# Patient Record
Sex: Male | Born: 1955 | Race: White | Hispanic: No | Marital: Married | Smoking: Former smoker
Health system: Southern US, Community
[De-identification: ages and names within clinical notes are randomized; demographics above are authoritative.]

## PROBLEM LIST (undated history)

## (undated) DIAGNOSIS — M5136 Other intervertebral disc degeneration, lumbar region: Secondary | ICD-10-CM

## (undated) DIAGNOSIS — M51369 Other intervertebral disc degeneration, lumbar region without mention of lumbar back pain or lower extremity pain: Secondary | ICD-10-CM

## (undated) DIAGNOSIS — I10 Essential (primary) hypertension: Secondary | ICD-10-CM

## (undated) DIAGNOSIS — C801 Malignant (primary) neoplasm, unspecified: Secondary | ICD-10-CM

## (undated) DIAGNOSIS — IMO0002 Reserved for concepts with insufficient information to code with codable children: Secondary | ICD-10-CM

## (undated) DIAGNOSIS — E785 Hyperlipidemia, unspecified: Secondary | ICD-10-CM

## (undated) DIAGNOSIS — S838X2A Sprain of other specified parts of left knee, initial encounter: Secondary | ICD-10-CM

## (undated) DIAGNOSIS — R011 Cardiac murmur, unspecified: Secondary | ICD-10-CM

## (undated) DIAGNOSIS — Z85828 Personal history of other malignant neoplasm of skin: Secondary | ICD-10-CM

## (undated) HISTORY — DX: Reserved for concepts with insufficient information to code with codable children: IMO0002

## (undated) HISTORY — DX: Other intervertebral disc degeneration, lumbar region: M51.36

## (undated) HISTORY — DX: Cardiac murmur, unspecified: R01.1

## (undated) HISTORY — DX: Malignant (primary) neoplasm, unspecified: C80.1

## (undated) HISTORY — PX: MOHS SURGERY: SUR867

## (undated) HISTORY — DX: Essential (primary) hypertension: I10

## (undated) HISTORY — DX: Personal history of other malignant neoplasm of skin: Z85.828

## (undated) HISTORY — PX: POLYPECTOMY: SHX149

## (undated) HISTORY — DX: Sprain of other specified parts of left knee, initial encounter: S83.8X2A

## (undated) HISTORY — DX: Hyperlipidemia, unspecified: E78.5

## (undated) HISTORY — PX: KNEE SURGERY: SHX244

## (undated) HISTORY — DX: Other intervertebral disc degeneration, lumbar region without mention of lumbar back pain or lower extremity pain: M51.369

---

## 2004-01-12 ENCOUNTER — Ambulatory Visit: Payer: Self-pay | Admitting: Internal Medicine

## 2004-01-17 ENCOUNTER — Ambulatory Visit: Payer: Self-pay | Admitting: Internal Medicine

## 2005-01-08 ENCOUNTER — Ambulatory Visit: Payer: Self-pay | Admitting: Internal Medicine

## 2005-01-24 ENCOUNTER — Ambulatory Visit: Payer: Self-pay | Admitting: Internal Medicine

## 2005-02-05 HISTORY — PX: COLONOSCOPY: SHX174

## 2005-02-05 LAB — HM COLONOSCOPY

## 2005-12-11 ENCOUNTER — Ambulatory Visit: Payer: Self-pay | Admitting: Internal Medicine

## 2005-12-11 LAB — CONVERTED CEMR LAB
ALT: 26 units/L (ref 0–40)
AST: 32 units/L (ref 0–37)
Cholesterol: 181 mg/dL (ref 0–200)
HDL: 32.8 mg/dL — ABNORMAL LOW (ref >39.0)
LDL Cholesterol: 131 mg/dL — ABNORMAL HIGH (ref 0–99)
Total CK: 460 units/L (ref 7–195)

## 2005-12-14 ENCOUNTER — Ambulatory Visit: Payer: Self-pay | Admitting: Internal Medicine

## 2006-04-02 ENCOUNTER — Ambulatory Visit: Payer: Self-pay | Admitting: Internal Medicine

## 2006-04-15 ENCOUNTER — Ambulatory Visit: Payer: Self-pay | Admitting: Internal Medicine

## 2006-06-20 ENCOUNTER — Ambulatory Visit: Payer: Self-pay | Admitting: Gastroenterology

## 2006-07-02 ENCOUNTER — Encounter: Payer: Self-pay | Admitting: Internal Medicine

## 2006-07-02 ENCOUNTER — Ambulatory Visit: Payer: Self-pay | Admitting: Gastroenterology

## 2006-10-16 ENCOUNTER — Ambulatory Visit: Payer: Self-pay | Admitting: Internal Medicine

## 2006-10-20 LAB — CONVERTED CEMR LAB
LDL Cholesterol: 124 mg/dL — ABNORMAL HIGH (ref 0–99)
Total CHOL/HDL Ratio: 5.7

## 2006-10-21 ENCOUNTER — Encounter (INDEPENDENT_AMBULATORY_CARE_PROVIDER_SITE_OTHER): Payer: Self-pay | Admitting: *Deleted

## 2006-11-19 ENCOUNTER — Ambulatory Visit: Payer: Self-pay | Admitting: Internal Medicine

## 2006-11-19 DIAGNOSIS — E782 Mixed hyperlipidemia: Secondary | ICD-10-CM | POA: Insufficient documentation

## 2006-11-19 LAB — CONVERTED CEMR LAB
Cholesterol, target level: 200 mg/dL
LDL Goal: 130 mg/dL

## 2006-12-25 ENCOUNTER — Telehealth (INDEPENDENT_AMBULATORY_CARE_PROVIDER_SITE_OTHER): Payer: Self-pay | Admitting: *Deleted

## 2007-12-30 ENCOUNTER — Ambulatory Visit: Payer: Self-pay | Admitting: Internal Medicine

## 2007-12-30 DIAGNOSIS — I10 Essential (primary) hypertension: Secondary | ICD-10-CM | POA: Insufficient documentation

## 2007-12-30 DIAGNOSIS — M109 Gout, unspecified: Secondary | ICD-10-CM | POA: Insufficient documentation

## 2007-12-30 DIAGNOSIS — Z8739 Personal history of other diseases of the musculoskeletal system and connective tissue: Secondary | ICD-10-CM | POA: Insufficient documentation

## 2007-12-31 ENCOUNTER — Telehealth (INDEPENDENT_AMBULATORY_CARE_PROVIDER_SITE_OTHER): Payer: Self-pay | Admitting: *Deleted

## 2008-01-09 ENCOUNTER — Encounter (INDEPENDENT_AMBULATORY_CARE_PROVIDER_SITE_OTHER): Payer: Self-pay | Admitting: *Deleted

## 2008-01-12 ENCOUNTER — Telehealth (INDEPENDENT_AMBULATORY_CARE_PROVIDER_SITE_OTHER): Payer: Self-pay | Admitting: *Deleted

## 2008-01-13 ENCOUNTER — Encounter: Payer: Self-pay | Admitting: Internal Medicine

## 2008-01-13 ENCOUNTER — Telehealth: Payer: Self-pay | Admitting: Internal Medicine

## 2008-04-09 ENCOUNTER — Telehealth (INDEPENDENT_AMBULATORY_CARE_PROVIDER_SITE_OTHER): Payer: Self-pay | Admitting: *Deleted

## 2008-04-12 ENCOUNTER — Ambulatory Visit: Payer: Self-pay | Admitting: Internal Medicine

## 2008-04-18 LAB — CONVERTED CEMR LAB
Bilirubin, Direct: 0.1 mg/dL (ref 0.0–0.3)
Cholesterol: 195 mg/dL (ref 0–200)
LDL Cholesterol: 132 mg/dL — ABNORMAL HIGH (ref 0–99)
Triglycerides: 120 mg/dL (ref 0–149)
VLDL: 24 mg/dL (ref 0–40)

## 2008-04-22 ENCOUNTER — Encounter (INDEPENDENT_AMBULATORY_CARE_PROVIDER_SITE_OTHER): Payer: Self-pay | Admitting: *Deleted

## 2008-04-22 ENCOUNTER — Telehealth (INDEPENDENT_AMBULATORY_CARE_PROVIDER_SITE_OTHER): Payer: Self-pay | Admitting: *Deleted

## 2008-09-29 ENCOUNTER — Ambulatory Visit: Payer: Self-pay | Admitting: Internal Medicine

## 2008-10-12 LAB — CONVERTED CEMR LAB
Alkaline Phosphatase: 61 units/L (ref 39–117)
Bilirubin, Direct: 0 mg/dL (ref 0.0–0.3)
LDL Cholesterol: 106 mg/dL — ABNORMAL HIGH (ref 0–99)
Total Bilirubin: 0.8 mg/dL (ref 0.3–1.2)
Total CHOL/HDL Ratio: 4
VLDL: 24.2 mg/dL (ref 0.0–40.0)

## 2009-03-30 ENCOUNTER — Ambulatory Visit: Payer: Self-pay | Admitting: Internal Medicine

## 2009-06-30 ENCOUNTER — Ambulatory Visit: Payer: Self-pay | Admitting: Internal Medicine

## 2009-07-05 LAB — CONVERTED CEMR LAB
AST: 26 units/L (ref 0–37)
Alkaline Phosphatase: 68 units/L (ref 39–117)
Basophils Absolute: 0 10*3/uL (ref 0.0–0.1)
Basophils Relative: 0.4 % (ref 0.0–3.0)
Bilirubin, Direct: 0.1 mg/dL (ref 0.0–0.3)
CO2: 25 meq/L (ref 19–32)
Calcium: 8.9 mg/dL (ref 8.4–10.5)
Creatinine, Ser: 1.1 mg/dL (ref 0.4–1.5)
Eosinophils Absolute: 0.1 10*3/uL (ref 0.0–0.7)
GFR calc non Af Amer: 77.28 mL/min (ref >60)
HDL: 45.7 mg/dL (ref >39.00)
LDL Cholesterol: 109 mg/dL — ABNORMAL HIGH (ref 0–99)
Lymphocytes Relative: 25.1 % (ref 12.0–46.0)
MCHC: 34.5 g/dL (ref 30.0–36.0)
Monocytes Relative: 8.4 % (ref 3.0–12.0)
Neutrophils Relative %: 64.1 % (ref 43.0–77.0)
RBC: 4.62 M/uL (ref 4.22–5.81)
Sodium: 138 meq/L (ref 135–145)
Total CHOL/HDL Ratio: 4
Total Protein: 7.3 g/dL (ref 6.0–8.3)
Triglycerides: 163 mg/dL — ABNORMAL HIGH (ref 0.0–149.0)
Uric Acid, Serum: 7.4 mg/dL (ref 4.0–7.8)
VLDL: 32.6 mg/dL (ref 0.0–40.0)

## 2010-03-02 ENCOUNTER — Telehealth (INDEPENDENT_AMBULATORY_CARE_PROVIDER_SITE_OTHER): Payer: Self-pay | Admitting: *Deleted

## 2010-03-05 LAB — CONVERTED CEMR LAB
ALT: 26 units/L (ref 0–40)
AST: 20 units/L (ref 0–37)
AST: 29 units/L (ref 0–37)
Albumin: 3.8 g/dL (ref 3.5–5.2)
Albumin: 4.4 g/dL (ref 3.5–5.2)
Alkaline Phosphatase: 65 units/L (ref 39–117)
BUN: 13 mg/dL (ref 6–23)
BUN: 9 mg/dL (ref 6–23)
Basophils Absolute: 0 10*3/uL (ref 0.0–0.1)
Basophils Relative: 0.3 % (ref 0.0–1.0)
Chloride: 103 meq/L (ref 96–112)
Chloride: 106 meq/L (ref 96–112)
Eosinophils Absolute: 0.1 10*3/uL (ref 0.0–0.7)
Eosinophils Relative: 1.9 % (ref 0.0–5.0)
GFR calc Af Amer: 101 mL/min
GFR calc non Af Amer: 83 mL/min
HCT: 40.6 % (ref 39.0–52.0)
HDL: 46.3 mg/dL (ref >39.0)
LDL Cholesterol: 126 mg/dL — ABNORMAL HIGH (ref 0–99)
Lymphocytes Relative: 29.2 % (ref 12.0–46.0)
MCHC: 34.5 g/dL (ref 30.0–36.0)
MCV: 88.9 fL (ref 78.0–100.0)
Monocytes Relative: 2.6 % — ABNORMAL LOW (ref 3.0–12.0)
Neutrophils Relative %: 60.8 % (ref 43.0–77.0)
Neutrophils Relative %: 64.6 % (ref 43.0–77.0)
Platelets: 293 10*3/uL (ref 150–400)
Potassium: 4.2 meq/L (ref 3.5–5.1)
Potassium: 4.3 meq/L (ref 3.5–5.1)
RBC: 4.65 M/uL (ref 4.22–5.81)
RDW: 11.9 % (ref 11.5–14.6)
Sodium: 142 meq/L (ref 135–145)
Total Bilirubin: 0.7 mg/dL (ref 0.3–1.2)
Total CHOL/HDL Ratio: 4.3
VLDL: 27 mg/dL (ref 0–40)
WBC: 7.5 10*3/uL (ref 4.5–10.5)

## 2010-03-07 NOTE — Assessment & Plan Note (Signed)
Summary: backpain/kdc   Vital Signs:  Patient profile:   55 year old male Weight:      233 pounds BMI:     29.62 Temp:     98.2 degrees F oral Pulse rate:   76 / minute Resp:     15 per minute BP sitting:   138 / 80  (left arm) Cuff size:   large  Vitals Entered By: Shonna Chock (March 30, 2009 4:58 PM) CC: Back Pain since Monday Comments REVIEWED MED LIST, PATIENT AGREED DOSE AND INSTRUCTION CORRECT    CC:  Back Pain since Monday.  History of Present Illness: Acute back pain getting out of truck with constant spasming over 48 hrs. Physical Therapy , massage & stretching by  Casimiro Needle ?, Sports Therapist  @ his home.PMH of herniated disc LS spine as per Dr  Lesia Sago. Rx: Aleve & Relafen help.  Allergies: 1)  ! Pcn 2)  ! Lovaza (Omega-3-Acid Ethyl Esters)  Review of Systems General:  Denies chills, fever, and sweats. GU:  Denies incontinence. MS:  Complains of low back pain; denies joint pain, joint redness, and joint swelling. Derm:  Denies lesion(s) and rash. Neuro:  Denies brief paralysis, numbness, tingling, and weakness.  Physical Exam  General:  Uncomfortable   but in no acute distress; alert,appropriate and cooperative throughout examination Abdomen:  Bowel sounds positive,abdomen soft and non-tender without masses, organomegaly or hernias noted. Msk:  Slight tenderness R LS area to percussion Extremities:  No clubbing, cyanosis, edema, or deformity noted.  No radicular pain with SLR bilaterally but muscular pain with SLR. Classic low back crawl Neurologic:  alert & oriented X3, strength normal in all extremities, and DTRs symmetrical and normal.  Toe & heel walking WNL Skin:  Intact without suspicious lesions or rashes Psych:  normally interactive and good eye contact.     Impression & Recommendations:  Problem # 1:  LOW BACK PAIN, ACUTE (ICD-724.2)  His updated medication list for this problem includes:    Hydrocodone-acetaminophen 5-500 Mg Tabs  (Hydrocodone-acetaminophen) .Marland Kitchen... 1-2 q 4-6 hrs as needed    Cyclobenzaprine Hcl 5 Mg Tabs (Cyclobenzaprine hcl) .Marland Kitchen... 1 two times a day & 2 at bedtime as needed  Complete Medication List: 1)  Benazepril Hcl 40 Mg Tabs (Benazepril hcl) .Marland Kitchen.. 1 qd 2)  Toprol Xl 50 Mg Xr24h-tab (Metoprolol succinate) .... 1/2 -1 once daily to keep bp < 130/85 on average 3)  Welchol 3.75 G Susp (colesevelam Hcl)  .... Mix powder with 8 oz h2o, take with meal 4)  Hydrocodone-acetaminophen 5-500 Mg Tabs (Hydrocodone-acetaminophen) .Marland Kitchen.. 1-2 q 4-6 hrs as needed 5)  Cyclobenzaprine Hcl 5 Mg Tabs (Cyclobenzaprine hcl) .Marland Kitchen.. 1 two times a day & 2 at bedtime as needed  Patient Instructions: 1)  Continue PT & stretching. Consider an Inversion table if symptoms persist. Fill  Vicodin if Aleve 1-2 every 8-12 hrs with food as needed  ineffective  Prescriptions: CYCLOBENZAPRINE HCL 5 MG TABS (CYCLOBENZAPRINE HCL) 1 two times a day & 2 at bedtime as needed  #20 x 1   Entered and Authorized by:   Marga Melnick MD   Signed by:   Marga Melnick MD on 03/30/2009   Method used:   Faxed to ...       [facility]. [phone]* (retail)       [phone]       [place], [place]  [address]  Ph: [phone]       Fax: [phone]   RxID:   [id number] HYDROCODONE-ACETAMINOPHEN 5-500 MG TABS (HYDROCODONE-ACETAMINOPHEN) 1-2 q 4-6 hrs as needed  #30 x 0   Entered and Authorized by:   Marga Melnick MD   Signed by:   Marga Melnick MD on 03/30/2009   Method used:   Print then Give to Patient   RxID:   [id number]

## 2010-03-07 NOTE — Assessment & Plan Note (Signed)
Summary: CPX AND FASTING LABS///SPH   Vital Signs:  Patient profile:   55 year old male Height:      75 inches Weight:      234 pounds BMI:     29.35 Temp:     98.2 degrees F oral Pulse rate:   66 / minute Resp:     14 per minute BP sitting:   110 / 72  (left arm) Cuff size:   large  Vitals Entered By: Shonna Chock (Jun 30, 2009 9:59 AM)  CC: Lipid Management Comments REVIEWED MED LIST, PATIENT AGREED DOSE AND INSTRUCTION CORRECT    CC:  Lipid Management.  History of Present Illness: [name] is here for a physical; he is asymptomatic.  Lipid Management History:      Positive NCEP/ATP III risk factors include male age 52 years old or older, HDL cholesterol less than 40, and hypertension.  Negative NCEP/ATP III risk factors include non-diabetic, no family history for ischemic heart disease, non-tobacco-user status, no ASHD (atherosclerotic heart disease), no prior stroke/TIA, no peripheral vascular disease, and no history of aortic aneurysm.     Allergies: 1)  ! Pcn 2)  ! Lovaza (Omega-3-Acid Ethyl Esters)  Past History:  Past Medical History: DDD L5-6 ; MVP (Clinically,no 2 D Echo) Hyperlipidemia: [place] LDL goal = < 130. NMR 2008:LDL 118(1993/1506)TG 142,HDL 42. NMR LDL goal = < 80. Hypertension  Past Surgical History: R knee surgery 1976 for sports related torn  ligament/cartilage; Colonoscopy negative  2008  Family History: Father: HTN,MVP,elevated PSA, Mantle Cell Lymphoma Mother: valvular heart disease, HTN, ?  MI   Siblings: bro: DDD; MGF: MI @ 70,skin cancer; PGM: DM,HTN  Social History: Occupation:Stock Broker Married Alcohol use-yes: socially Regular exercise-yes: 3-4X/week 45-60 min  Review of Systems       The patient complains of decreased hearing.  The patient denies anorexia, fever, weight loss, weight gain, vision loss, hoarseness, chest pain, syncope, dyspnea on exertion, peripheral edema, prolonged cough, headaches, hemoptysis, abdominal  pain, melena, hematochezia, severe indigestion/heartburn, hematuria, suspicious skin lesions, depression, unusual weight change, abnormal bleeding, enlarged lymph nodes, and angioedema.         Some hearing loss from hunting foryears.  Physical Exam  General:  well-nourished; alert,appropriate and cooperative throughout examination Head:  Normocephalic and atraumatic without obvious abnormalities. No apparent alopecia or balding. Eyes:  No corneal or conjunctival inflammation noted. Perrla. Funduscopic exam benign, without hemorrhages, exudates or papilledema.  Ears:  External ear exam shows no significant lesions or deformities.  Otoscopic examination reveals clear canals, tympanic membranes are intact bilaterally without bulging, retraction, inflammation or discharge. Hearing is grossly normal bilaterally. Nose:  External nasal examination shows no deformity or inflammation. Nasal mucosa are pink and moist without lesions or exudates. Mouth:  Oral mucosa and oropharynx without lesions or exudates.  Teeth in good repair. Neck:  No deformities, masses, or tenderness noted. Lungs:  Normal respiratory effort, chest expands symmetrically. Lungs are clear to auscultation, no crackles or wheezes. Heart:  Normal rate and regular rhythm. S1 and S2 normal without gallop, murmur, click, rub or other extra sounds. Abdomen:  Bowel sounds positive,abdomen soft and non-tender without masses, organomegaly or hernias noted. Rectal:  No external abnormalities noted. Normal sphincter tone. No rectal masses or tenderness. Genitalia:  Testes bilaterally descended without nodularity, tenderness or masses. No scrotal masses or lesions. No penis lesions or urethral discharge. Prostate:  Prostate gland firm and smooth, no enlargement, nodularity, tenderness, mass, asymmetry or induration. Msk:  No  deformity or scoliosis noted of thoracic or lumbar spine.   Pulses:  R and L carotid,radial,dorsalis pedis and posterior  tibial pulses are full and equal bilaterally Extremities:  No clubbing, cyanosis, edema, or deformity noted with normal full range of motion of all joints.   Neurologic:  alert & oriented X3, gait normal, and DTRs symmetrical and normal.   Skin:  Intact without suspicious lesions or rashes Cervical Nodes:  No lymphadenopathy noted Axillary Nodes:  No palpable lymphadenopathy Inguinal Nodes:  No significant adenopathy Psych:  memory intact for recent and remote, normally interactive, and good eye contact.     Impression & Recommendations:  Problem # 1:  ROUTINE GENERAL MEDICAL EXAM@HEALTH  CARE FACL (ICD-V70.0)  Orders: EKG w/ Interpretation (93000) Venipuncture (98119) TLB-Lipid Panel (80061-LIPID) TLB-BMP (Basic Metabolic Panel-BMET) (80048-METABOL) TLB-CBC Platelet - w/Differential (85025-CBCD) TLB-Hepatic/Liver Function Pnl (80076-HEPATIC) TLB-TSH (Thyroid Stimulating Hormone) (84443-TSH) TLB-PSA (Prostate Specific Antigen) (84153-PSA) TLB-Uric Acid, Blood (84550-URIC)  Problem # 2:  HYPERLIPIDEMIA (ICD-272.2)  Orders: Venipuncture (14782) TLB-Lipid Panel (80061-LIPID)  Problem # 3:  HYPERTENSION (ICD-401.9)  His updated medication list for this problem includes:    Benazepril Hcl 40 Mg Tabs (Benazepril hcl) .Marland Kitchen... 1 qd    Toprol Xl 50 Mg Xr24h-tab (Metoprolol succinate) .Marland Kitchen... 1/2 -1 once daily to keep bp < 130/85 on average  Orders: EKG w/ Interpretation (93000) Venipuncture (95621)  Problem # 4:  GOUT, UNSPECIFIED (ICD-274.9)  PMH of  Orders: Venipuncture (30865) TLB-Uric Acid, Blood (84550-URIC)  Complete Medication List: 1)  Benazepril Hcl 40 Mg Tabs (Benazepril hcl) .Marland Kitchen.. 1 qd 2)  Toprol Xl 50 Mg Xr24h-tab (Metoprolol succinate) .... 1/2 -1 once daily to keep bp < 130/85 on average 3)  Welchol 3.75 G Susp (colesevelam Hcl)  .... Mix powder with 8 oz h2o, take with meal 4)  Multivitamins Tabs (Multiple vitamin) .... 2 mega mens daily  Lipid  Assessment/Plan:      Based on NCEP/ATP III, the patient's risk factor category is "2 or more risk factors and a calculated 10 year CAD risk of < 20%".  The patient's lipid goals are as follows: Total cholesterol goal is 200; LDL cholesterol goal is 90; HDL cholesterol goal is 40; Triglyceride goal is 150.  His LDL cholesterol goal has not been met.  Secondary causes for hyperlipidemia have been ruled out.  He has been counseled on adjunctive measures for lowering his cholesterol and has been provided with dietary instructions.    Patient Instructions: 1)  Check your Blood Pressure regularly. Your goal = AVERAGE < 135/85. Prescriptions: WELCHOL 3.75 G  SUSP  (COLESEVELAM HCL) mix powder with 8 oz H2O, take with meal  #1 month x 11   Entered and Authorized by:   Marga Melnick MD   Signed by:   Marga Melnick MD on 06/30/2009   Method used:   Print then Give to Patient   RxID:   [id number] TOPROL XL 50 MG XR24H-TAB (METOPROLOL SUCCINATE) 1/2 -1 once daily to keep BP < 130/85 ON AVERAGE  #90 x 1   Entered and Authorized by:   Marga Melnick MD   Signed by:   Marga Melnick MD on 06/30/2009   Method used:   Print then Give to Patient   RxID:   [id number] BENAZEPRIL HCL 40 MG TABS (BENAZEPRIL HCL) 1 qd  #90 Tablet x 3   Entered and Authorized by:   Marga Melnick MD   Signed by:   Marga Melnick MD on 06/30/2009   Method used:  Print then Give to Patient   RxID:   [id number]

## 2010-03-09 NOTE — Progress Notes (Signed)
Summary: refill  Phone Note Refill Request Message from:  Fax from Pharmacy on March 02, 2010 10:25 AM  Refills Requested: Medication #1:  WELCHOL 3.75 G  SUSP  (COLESEVELAM HCL) mix powder with 8 oz H2O [facility] - fax [phone]  Initial call taken by: Okey Regal [place],  March 02, 2010 10:26 AM    Prescriptions: WELCHOL 3.75 G  SUSP  (COLESEVELAM HCL) mix powder with 8 oz H2O, take with meal  #28mo x 1   Entered by:   Shonna Chock CMA   Authorized by:   Marga Melnick MD   Signed by:   Shonna Chock CMA on 03/02/2010   Method used:   Faxed to ...       [facility] (mail-order)             , [place]         Ph: [phone]       Fax: [phone]   RxID:   [id number]

## 2010-06-23 NOTE — Assessment & Plan Note (Signed)
[facility]                        [place] OFFICE NOTE   HARIS, BAACK                    MRN:          [id number]  DATE:04/15/2006                            DOB:          06-May-1955    Bradley Cole was seen for a comprehensive physical exam on April 15, 2006 at the age of 72.   He has no active symptoms or complaints at this time.   PAST MEDICAL HISTORY:  Is unchanged.  He had surgery in the right knee  for a soccer injury.  He also had a history of slipped disc.  History of  mitral valve prolapse.  Otherwise he has not been in the hospital  overnight.   He is presently on:  1. Toprol XL 50 one daily.  2. Benazepril  20/12.5 daily.  3. Relafen 750 as needed.  4. Enteric coated aspirin 81 mg.   FAMILY HISTORY:  Is positive for myocardial infarction, diabetes,  hypertension and valvular heart disease.  Specifically, both mother and  father had valvular heart disease.  His father also had an elevated PSA.   He does not smoke.  He drinks socially.  He walks 3 to 4 times a week,  45 to 60 minutes, works out in the gym for 45 to 60 minutes with no  cardiopulmonary symptoms.   He is 6 foot, 3 inches, weight is 235.6, which is up less than 6 pounds.  Pulse is 56 and regular.  Respiratory rate 14 and blood pressure 108/68.   REVIEW OF SYSTEMS:  Was reviewed in total and as stated he is  asymptomatic.   Complete physical examination was done with the exception of cranial  nerve exam.  He had minimal arterial changes on fundal exam. No spiking  finding or solar changes of the skin.  He has seen a dermatologist in  the past.  Efudex has apparently been recommended.  The cardiac exam was totally negative.  Prolapse was not obvious.  Prostate was upper limits of normal.  Hemoccult testing was negative.  He has minimal crepitus in the knees.  The remainder of the exam was  totally negative.   EKG reveals sinus bradycardia  and was normal.  Also normal were CBC and  diff,comprehensive metabolic profile, GFR, TSH, and PSA.   NMR lipid profile with LDL of 118, with 1,993 total particles and 1,506  small dense particles.  This would be associated with a 15% to 20% risk.  The profile suggested some dietary component but chiefly a genetic  issue.  Restriction of white carbs& desserts would be recommended.  [facility] or Mediterranean Diet would be appropriate. Generic  Pravastatin was discussed; he will consider this if TLC interventions  fail to correct the risks.   Because of his age a colonoscopy surveillance will be recommended.   Because of excellent blood pressure control, it is recommended that  Toprol be decreased to 1/2 pill daily.     Titus Dubin. Alwyn Ren, MD,FACP,FCCP  Electronically Signed    [facility]  DD: 04/15/2006  DT: 04/15/2006  Job #: [id number]

## 2010-06-28 ENCOUNTER — Other Ambulatory Visit: Payer: Self-pay | Admitting: *Deleted

## 2010-06-28 DIAGNOSIS — Z0000 Encounter for general adult medical examination without abnormal findings: Secondary | ICD-10-CM

## 2010-06-29 ENCOUNTER — Other Ambulatory Visit (INDEPENDENT_AMBULATORY_CARE_PROVIDER_SITE_OTHER): Payer: BC Managed Care – PPO

## 2010-06-29 DIAGNOSIS — Z0000 Encounter for general adult medical examination without abnormal findings: Secondary | ICD-10-CM

## 2010-06-29 LAB — CBC WITH DIFFERENTIAL/PLATELET
Basophils Relative: 0.3 % (ref 0.0–3.0)
Eosinophils Absolute: 0.1 10*3/uL (ref 0.0–0.7)
Eosinophils Relative: 2.3 % (ref 0.0–5.0)
Lymphocytes Relative: 29.8 % (ref 12.0–46.0)
Neutrophils Relative %: 59.3 % (ref 43.0–77.0)
RBC: 4.47 Mil/uL (ref 4.22–5.81)
WBC: 6.4 10*3/uL (ref 4.5–10.5)

## 2010-06-29 LAB — BASIC METABOLIC PANEL
CO2: 29 mEq/L (ref 19–32)
GFR: 74.56 mL/min (ref >60.00)
Glucose, Bld: 95 mg/dL (ref 70–99)
Potassium: 4.4 mEq/L (ref 3.5–5.1)
Sodium: 137 mEq/L (ref 135–145)

## 2010-06-29 LAB — LIPID PANEL
Cholesterol: 157 mg/dL (ref 0–200)
HDL: 41.1 mg/dL (ref >39.00)
Triglycerides: 140 mg/dL (ref 0.0–149.0)
VLDL: 28 mg/dL (ref 0.0–40.0)

## 2010-06-29 LAB — HEPATIC FUNCTION PANEL
ALT: 24 U/L (ref 0–53)
Total Protein: 7.2 g/dL (ref 6.0–8.3)

## 2010-07-06 ENCOUNTER — Encounter: Payer: Self-pay | Admitting: Internal Medicine

## 2010-07-07 ENCOUNTER — Encounter: Payer: BC Managed Care – PPO | Admitting: Internal Medicine

## 2010-07-07 ENCOUNTER — Encounter: Payer: Self-pay | Admitting: Internal Medicine

## 2010-07-18 ENCOUNTER — Encounter: Payer: Self-pay | Admitting: Internal Medicine

## 2010-07-18 ENCOUNTER — Ambulatory Visit (INDEPENDENT_AMBULATORY_CARE_PROVIDER_SITE_OTHER): Payer: BC Managed Care – PPO | Admitting: Internal Medicine

## 2010-07-18 VITALS — BP 116/68 | HR 56 | Temp 97.8°F | Resp 12 | Ht 75.5 in | Wt 241.2 lb

## 2010-07-18 DIAGNOSIS — E782 Mixed hyperlipidemia: Secondary | ICD-10-CM

## 2010-07-18 DIAGNOSIS — I10 Essential (primary) hypertension: Secondary | ICD-10-CM

## 2010-07-18 DIAGNOSIS — Z0000 Encounter for general adult medical examination without abnormal findings: Secondary | ICD-10-CM

## 2010-07-18 MED ORDER — BENAZEPRIL HCL 40 MG PO TABS: 40.0000 mg | ORAL_TABLET | Freq: Every day | ORAL | Status: DC

## 2010-07-18 MED ORDER — COLESEVELAM HCL 3.75 G PO PACK: 3.7500 | PACK | ORAL | Status: DC

## 2010-07-18 NOTE — Patient Instructions (Signed)
Preventive Health Care: Exercise at least 30-45 minutes a day,  3-4 days a week.  Eat a low-fat diet with lots of fruits and vegetables, up to 7-9 servings per day. Avoid obesity; your goal is waist measurement < 40 inches.Consume less than 40 grams of sugar per day from foods & drinks with High Fructose Corn Sugar as #2,3 or # 4 on label.   

## 2010-07-18 NOTE — Progress Notes (Signed)
  Subjective:    Patient ID: Bradley Cole, male    DOB: 1955-02-13, 55 y.o.   MRN: [id number]  HPI [name] is here for a physical; he is asymptomatic.   Review of Systems Patient reports no significant vision  changes,anorexia, weight change, fever ,adenopathy, persistant / recurrent hoarseness, swallowing issues, chest pain,palpitations, edema,persistant / recurrent cough, hemoptysis, dyspnea(rest, exertional, paroxysmal nocturnal), gastrointestinal  bleeding (melena, rectal bleeding), abdominal pain, excessive heart burn, GU symptoms( dysuria, hematuria, pyuria, voiding/incontinence  issues) syncope, focal weakness, memory loss,numbness & tingling, skin/hair/nail changes,depression, anxiety, abnormal bruising/bleeding, musculoskeletal symptoms/signs.  Long term hearing loss. Intermittent dry cough.     Objective:   Physical Exam Gen.: Healthy and well-nourished in appearance. Alert, appropriate and cooperative throughout exam. Head: Normocephalic without obvious abnormalities;  no alopecia  Eyes: No corneal or conjunctival inflammation noted. Pupils equal round reactive to light and accommodation. Fundal exam is benign without hemorrhages, exudate, papilledema. Extraocular motion intact. Vision grossly normal. Ears: External  ear exam reveals no significant lesions or deformities. Canals clear .TMs normal. Hearing is grossly normal bilaterally. Nose: External nasal exam reveals no deformity or inflammation. Nasal mucosa are pink and moist. No lesions or exudates noted. Septum not deviated  Mouth: Oral mucosa and oropharynx reveal no lesions or exudates. Teeth in good repair. Neck: No deformities, masses, or tenderness noted. Range of motion & . Thyroid normal. Lungs: Normal respiratory effort; chest expands symmetrically. Lungs are clear to auscultation without rales, wheezes, or increased work of breathing. Heart: Slow, regular rhythm. Normal S1 and S2. No gallop, click, or rub. No   murmur. Abdomen: Bowel sounds normal; abdomen soft and nontender. No masses, organomegaly or hernias noted. Genitalia/ DRE : normal except L varicocele.                                                                                      Musculoskeletal/extremities: No deformity or scoliosis noted of  the thoracic or lumbar spine. No clubbing, cyanosis, edema, or deformity noted. Range of motion  normal .Tone & strength  normal.Joints normal. Nail health  good. Vascular: Carotid, radial artery, dorsalis pedis and dorsalis posterior tibial pulses are full and equal. No bruits present. Neurologic: Alert and oriented x3. Deep tendon reflexes symmetrical and normal.          Skin: Intact without suspicious lesions or rashes. Lymph: No cervical, axillary, or inguinal lymphadenopathy present. Psych: Mood and affect are normal. Normally interactive                                                                                         Assessment & Plan:  #1 Comprehensive physical; no acute issues #2 Problem List assessed & recommendations discussed #3 dry cough due to ACE-I (Benazepril); change to Losartan declined @ this time

## 2010-07-18 NOTE — Assessment & Plan Note (Signed)
Minimal LDL goal = < 100, ideally <04.

## 2010-08-01 ENCOUNTER — Other Ambulatory Visit: Payer: Self-pay | Admitting: Internal Medicine

## 2010-08-01 NOTE — Telephone Encounter (Signed)
RX has already been done.

## 2010-09-26 ENCOUNTER — Encounter: Payer: Self-pay | Admitting: Internal Medicine

## 2010-09-26 ENCOUNTER — Ambulatory Visit (INDEPENDENT_AMBULATORY_CARE_PROVIDER_SITE_OTHER): Payer: BC Managed Care – PPO | Admitting: Internal Medicine

## 2010-09-26 DIAGNOSIS — R209 Unspecified disturbances of skin sensation: Secondary | ICD-10-CM

## 2010-09-26 DIAGNOSIS — M545 Low back pain: Secondary | ICD-10-CM

## 2010-09-26 MED ORDER — TRAMADOL HCL 50 MG PO TABS: 50.0000 mg | ORAL_TABLET | Freq: Four times a day (QID) | ORAL | Status: DC | PRN

## 2010-09-26 MED ORDER — GABAPENTIN 100 MG PO CAPS: 100.0000 mg | ORAL_CAPSULE | ORAL | Status: DC

## 2010-09-26 NOTE — Patient Instructions (Signed)
Used the gabapentin every 8 hours as needed for the numbness in the fifth toe. The tramadol would be for the back pain. Long-term use of non-steroidals may  cause gastritis or ulcer

## 2010-09-26 NOTE — Progress Notes (Signed)
Subjective:    Patient ID: Bradley Cole, male    DOB: Sep 23, 1955, 55 y.o.   MRN: [id number]  HPI BACK PAIN: Location: LS area   Severity: recently up to 3; up to 7 last month . NSAIDS with ASA  , massage helped pain decrease in severity  . Major concern now  is numbness in toes.   Worse with: no specific trigger    Better with: supine position helps pain  Pain radiates to: L hip & knee as "hot spot"   Impaired range of motion: slight in LLE  History of repetitive motion:  no  History of overuse or hyperextension:  no  History of trauma:  No; pain started after reaching for dog bowel 08/26/2010 Past history of similar problem:  yes, bulging disc @ L4-5 diagnosed 1978 by Dr Anne Hahn. Steroid burst resolved pain then  Symptoms Numbness/tingling:  yes, intermittent numbness either 5th toe. Single episode of numbness in L buttock which resolved with walking  Weakness:  no  Red Flags Fever:  no  Headache:  no  Bowel/bladder dysfunction:  no  Both his father and brother have had degenerative disc disease in LS spine.Both had LS surgery; His father also neck surgery for degenerative disc disease. This was associated with radiculopathy.      Review of Systems     Objective:   Physical Exam Gen.: Healthy and well-nourished in appearance. Alert, appropriate and cooperative throughout exam. Lungs: Normal respiratory effort; chest expands symmetrically. Lungs are clear to auscultation without rales, wheezes, or increased work of breathing. Heart: Normal rate and rhythm. Normal S1 and S2. No gallop, click, or rub. No  murmur. Abdomen: Bowel sounds normal; abdomen soft and nontender. No masses, organomegaly or hernias noted. No AAA or bruit                                                        Musculoskeletal/extremities: No deformity or scoliosis noted of  the thoracic or lumbar spine. No clubbing, cyanosis, edema, or deformity noted. Range of motion  normal .Tone & strength   normal.Joints normal. Nail health  good. He is able to lie back on the exam table and sit up without help. Straight leg raising is negative bilaterally Vascular: Carotid, radial artery, dorsalis pedis and  posterior tibial pulses are full and equal. No bruits present. Neurologic: Alert and oriented x3. Deep tendon reflexes symmetrical and normal. Heel and toe walking is normal.          Skin: Intact without suspicious lesions or rashes. Lymph: No cervical, axillary  lymphadenopathy present. Psych: Mood and affect are normal. Normally interactive                                                                                        Assessment & Plan:  #1 low back pain with radiation to the left hip and knee. No neuromuscular deficit present on clinical exam.  #2 intermittent numbness in the fifth toe bilaterally.  Transient numbness in the left hip which has resolved.  #3 degenerative disc disease, past medical history of   Plan: See orders and recommendations.

## 2010-09-26 NOTE — Progress Notes (Signed)
  Subjective:    Patient ID: Bradley Cole, male    DOB: 04/23/1955, 55 y.o.   MRN: [id number]  HPI    Review of Systems     Objective:   Physical Exam light touch intact over feet        Assessment & Plan:

## 2010-10-30 ENCOUNTER — Encounter: Payer: Self-pay | Admitting: Internal Medicine

## 2010-10-30 ENCOUNTER — Ambulatory Visit (INDEPENDENT_AMBULATORY_CARE_PROVIDER_SITE_OTHER): Payer: BC Managed Care – PPO | Admitting: Internal Medicine

## 2010-10-30 VITALS — BP 124/80 | HR 69 | Temp 97.7°F | Wt 243.6 lb

## 2010-10-30 DIAGNOSIS — M545 Low back pain: Secondary | ICD-10-CM

## 2010-10-30 DIAGNOSIS — M25562 Pain in left knee: Secondary | ICD-10-CM

## 2010-10-30 DIAGNOSIS — M25569 Pain in unspecified knee: Secondary | ICD-10-CM

## 2010-10-30 MED ORDER — TRAMADOL HCL 50 MG PO TABS: 50.0000 mg | ORAL_TABLET | Freq: Four times a day (QID) | ORAL | Status: AC | PRN

## 2010-10-30 NOTE — Patient Instructions (Signed)
The best exercises for the low back include freestyle swimming, stretch aerobics, and yoga. 

## 2010-10-30 NOTE — Progress Notes (Signed)
  Subjective:    Patient ID: Bradley Gauss., male    DOB: 12-25-1955, 55 y.o.   MRN: [id number]  HPI Extremity pain Location:L medial  knee  Onset: in late August he had sustained exacerbation of chronic  back syndrome associated with "hot spots" in the hips with radiation as "points of pain" as far as the toes.A new phenomenon was numbness in 5th toes He also had numbness in buttocks & in 5th toes. All  Symptoms have resolved except for localized left medial knee symptoms as sharp pain until 9/20. Trigger/injury:chronic LB pain ; "bulging disc " diagnosed @ 19 Pain quality:sharp Pain severity:up to 7 Duration: see below Radiation: none with L knee pain, it remained localized Exacerbating factors:sitting for a period, improved after 1-3 minutes of ambulating Treatment/response:stretching exercises 9/20 & 9/21 Review of systems: Constitutional: no fever, chills, sweats, change in weight  Musculoskeletal:no  muscle cramps or pain; redness, or swelling. L knee stiff after sitting Skin:no rash, color change Neuro: no weakness; incontinence (stool/urine) Heme:no lymphadenopathy; abnormal bruising or bleeding       Review of Systems     Objective:   Physical Exam he is healthy and well-nourished, in no acute distress  Abdomen reveals normal bowel sounds; he has no organomegaly or masses. There is no aortic aneurysm.  Pedal pulses are intact.  Deep tendon reflexes and strength are normal.  He is able to lie back and set up without help. He has negative straight leg raising to 90 he  He has mild crepitus of the left knee. Subjectively his left knee pain can be induced by medial rotation of the knee. Range of motion is good.  Gait is normal to include heel and toe walking        Assessment & Plan:  #1 left knee pain he has an orthopedic issue, most likely due to 2 degenerative joint disease #2 he has chronic low back pain in the context of a diagnosis of bulging disc.  This has been associated with intermittent lumbosacral radiculopathy symptoms. History suggests L4 to S1 involvement  Plan: He should continue stretching to 3 times a week at least. Ergonomics should be assessed at his workplace.  Tramadol will be continued as needed. Zostrix cream or moist heat to the knee as needed would be recommended. After strenuous exercise; he should consider icing the knee.

## 2010-12-01 ENCOUNTER — Other Ambulatory Visit: Payer: Self-pay | Admitting: Internal Medicine

## 2010-12-25 ENCOUNTER — Other Ambulatory Visit: Payer: Self-pay | Admitting: Internal Medicine

## 2011-01-19 ENCOUNTER — Ambulatory Visit: Payer: BC Managed Care – PPO

## 2011-01-19 DIAGNOSIS — J069 Acute upper respiratory infection, unspecified: Secondary | ICD-10-CM

## 2011-03-16 ENCOUNTER — Ambulatory Visit: Payer: BC Managed Care – PPO | Admitting: Internal Medicine

## 2011-03-16 VITALS — BP 132/74 | HR 68 | Temp 98.1°F | Resp 16 | Ht 74.0 in | Wt 255.2 lb

## 2011-03-16 DIAGNOSIS — J209 Acute bronchitis, unspecified: Secondary | ICD-10-CM

## 2011-03-16 DIAGNOSIS — J40 Bronchitis, not specified as acute or chronic: Secondary | ICD-10-CM

## 2011-03-16 MED ORDER — PREDNISONE 20 MG PO TABS: ORAL_TABLET | ORAL | Status: AC

## 2011-03-16 MED ORDER — HYDROCODONE-HOMATROPINE 5-1.5 MG/5ML PO SYRP: 5.0000 mL | ORAL_SOLUTION | Freq: Three times a day (TID) | ORAL | Status: AC | PRN

## 2011-03-16 NOTE — Patient Instructions (Signed)
Take the prednisone as prescribed and use the Hycodan as needed for cough.  If not improving fill the script for a Zpak 250 mg #6 take as directed.  Bronchitis Bronchitis is the body's way of reacting to injury and/or infection (inflammation) of the bronchi. Bronchi are the air tubes that extend from the windpipe into the lungs. If the inflammation becomes severe, it may cause shortness of breath. CAUSES  Inflammation may be caused by:  A virus.   Germs (bacteria).   Dust.   Allergens.   Pollutants and many other irritants.  The cells lining the bronchial tree are covered with tiny hairs (cilia). These constantly beat upward, away from the lungs, toward the mouth. This keeps the lungs free of pollutants. When these cells become too irritated and are unable to do their job, mucus begins to develop. This causes the characteristic cough of bronchitis. The cough clears the lungs when the cilia are unable to do their job. Without either of these protective mechanisms, the mucus would settle in the lungs. Then you would develop pneumonia. Smoking is a common cause of bronchitis and can contribute to pneumonia. Stopping this habit is the single most important thing you can do to help yourself. TREATMENT   Your caregiver may prescribe an antibiotic if the cough is caused by bacteria. Also, medicines that open up your airways make it easier to breathe. Your caregiver may also recommend or prescribe an expectorant. It will loosen the mucus to be coughed up. Only take over-the-counter or prescription medicines for pain, discomfort, or fever as directed by your caregiver.   Removing whatever causes the problem (smoking, for example) is critical to preventing the problem from getting worse.   Cough suppressants may be prescribed for relief of cough symptoms.   Inhaled medicines may be prescribed to help with symptoms now and to help prevent problems from returning.   For those with recurrent (chronic)  bronchitis, there may be a need for steroid medicines.  SEEK IMMEDIATE MEDICAL CARE IF:   During treatment, you develop more pus-like mucus (purulent sputum).   You have a fever.   Your baby is older than 3 months with a rectal temperature of 102 F (38.9 C) or higher.   Your baby is 41 months old or younger with a rectal temperature of 100.4 F (38 C) or higher.   You become progressively more ill.   You have increased difficulty breathing, wheezing, or shortness of breath.  It is necessary to seek immediate medical care if you are elderly or sick from any other disease. MAKE SURE YOU:   Understand these instructions.   Will watch your condition.   Will get help right away if you are not doing well or get worse.  Document Released: 01/22/2005 Document Revised: 10/04/2010 Document Reviewed: 12/02/2007 [facility] Patient Information 2012 [place], [place].

## 2011-03-16 NOTE — Progress Notes (Signed)
  Subjective:    Patient ID: Bradley Cole., male    DOB: 1956-01-29, 56 y.o.   MRN: [id number]  Cough This is a new problem. The current episode started in the past 7 days. The problem has been unchanged. The problem occurs hourly. The cough is productive of sputum. Associated symptoms include nasal congestion, postnasal drip and rhinorrhea. Pertinent negatives include no chest pain, chills, ear pain, sore throat or wheezing. The symptoms are aggravated by cold air. Risk factors for lung disease include animal exposure and smoking/tobacco exposure. He has tried OTC cough suppressant for the symptoms. The treatment provided no relief.      Review of Systems  Constitutional: Negative.  Negative for chills.  HENT: Positive for congestion, rhinorrhea and postnasal drip. Negative for ear pain, nosebleeds, sore throat, facial swelling, neck pain and ear discharge.   Eyes: Negative.   Respiratory: Positive for cough. Negative for wheezing.   Cardiovascular: Negative.  Negative for chest pain.  Gastrointestinal: Negative.   Musculoskeletal: Negative.   Neurological: Negative.   Psychiatric/Behavioral: Negative.        Objective:   Physical Exam  Constitutional: He is oriented to person, place, and time. He appears well-developed and well-nourished.  HENT:  Head: Normocephalic.  Right Ear: External ear normal.  Left Ear: External ear normal.  Nose: Nose normal.  Mouth/Throat: Oropharynx is clear and moist.  Eyes: Conjunctivae are normal.  Neck: Neck supple.  Cardiovascular: Normal rate, regular rhythm and normal heart sounds.   Pulmonary/Chest: Effort normal and breath sounds normal.       No audible wheeze, loose moist cough observed during exam.  Musculoskeletal: Normal range of motion.  Neurological: He is alert and oriented to person, place, and time.  Skin: Skin is warm and dry.          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  Last time he had a URI  he did fill a zpack prescription as symptoms worsened when he went duck hunting.  Reviewed the pathophysiology of RAD and he agreed to try a short course of prednisone and hycodan cough syrup.  If he does not improve he may fill the script for Zpack 250 mg #6 to take as directed.  RTC if symptoms worsen despite treatment or with other concerns.

## 2011-03-17 ENCOUNTER — Encounter: Payer: Self-pay | Admitting: *Deleted

## 2011-03-17 DIAGNOSIS — IMO0002 Reserved for concepts with insufficient information to code with codable children: Secondary | ICD-10-CM | POA: Insufficient documentation

## 2011-03-17 DIAGNOSIS — E785 Hyperlipidemia, unspecified: Secondary | ICD-10-CM | POA: Insufficient documentation

## 2011-03-17 DIAGNOSIS — I10 Essential (primary) hypertension: Secondary | ICD-10-CM | POA: Insufficient documentation

## 2011-04-26 ENCOUNTER — Ambulatory Visit (INDEPENDENT_AMBULATORY_CARE_PROVIDER_SITE_OTHER): Payer: BC Managed Care – PPO | Admitting: Physician Assistant

## 2011-04-26 VITALS — BP 144/84 | HR 81 | Temp 98.8°F | Resp 18 | Ht 75.0 in | Wt 239.0 lb

## 2011-04-26 DIAGNOSIS — J3489 Other specified disorders of nose and nasal sinuses: Secondary | ICD-10-CM

## 2011-04-26 DIAGNOSIS — R05 Cough: Secondary | ICD-10-CM

## 2011-04-26 DIAGNOSIS — R0981 Nasal congestion: Secondary | ICD-10-CM

## 2011-04-26 MED ORDER — FLUTICASONE PROPIONATE 50 MCG/ACT NA SUSP: 2.0000 | Freq: Every day | NASAL | Status: DC

## 2011-04-26 MED ORDER — PREDNISONE 20 MG PO TABS: ORAL_TABLET | ORAL | Status: DC

## 2011-04-26 MED ORDER — ALBUTEROL SULFATE HFA 108 (90 BASE) MCG/ACT IN AERS: 2.0000 | INHALATION_SPRAY | RESPIRATORY_TRACT | Status: DC | PRN

## 2011-04-26 MED ORDER — AZITHROMYCIN 250 MG PO TABS: ORAL_TABLET | ORAL | Status: AC

## 2011-04-26 NOTE — Progress Notes (Signed)
  Subjective:    Patient ID: Bradley Cole., male    DOB: February 03, 1956, 56 y.o.   MRN: [id number]  HPI Mr. Hardacre is here today c/o recurrent cold sx.  Had URI 12/12, 2/13 and today.  Each have resolved but patient concerned they are all related.  Never gets sick this often.  Last visit, prescribed prednisone and was better quickly. Now with abrupt onset of nasal congestion with clear drainage, tight spastic cough triggered by temperature change, deep breath and talking. Mucous production in the morning but otherwise dry.  No fever or chills. No SOB.  Ear hurts. No myalgias. Was in [place] for Bike Week.  Stayed in a hotel for 4 days and smoked 2 cigars.  Felt this contributed to sx.    Review of Systems As above     Objective:   Physical Exam  Constitutional: He appears well-developed and well-nourished.  HENT:  Right Ear: Tympanic membrane normal.  Left Ear: Tympanic membrane normal.  Nose: Mucosal edema and rhinorrhea present.  Mouth/Throat: Posterior oropharyngeal erythema present.  Cardiovascular: Normal rate and regular rhythm.   Pulmonary/Chest: Effort normal and breath sounds normal.       Tight cough but no wheezing   Lymphadenopathy:    He has no cervical adenopathy.          Assessment & Plan:  Cough, likely RAD from allergen Nasal Congestion  Recommend Pro Air, Flonase and hold Prednisone.  Zpack for back up.

## 2011-04-29 ENCOUNTER — Other Ambulatory Visit: Payer: Self-pay | Admitting: Internal Medicine

## 2011-04-30 NOTE — Telephone Encounter (Signed)
Refill done.  

## 2011-12-19 ENCOUNTER — Other Ambulatory Visit: Payer: Self-pay | Admitting: Internal Medicine

## 2011-12-19 NOTE — Telephone Encounter (Signed)
Rx sent.    MW 

## 2012-01-14 ENCOUNTER — Telehealth: Payer: Self-pay | Admitting: Internal Medicine

## 2012-01-14 NOTE — Telephone Encounter (Signed)
Please bring your  blood pressure cuff & readings to office visit to verify that it is reliable.Blood Pressure Goal  Ideally is an AVERAGE < 135/85. This AVERAGE should be calculated from @ least 5-7 BP readings taken @ different times of day on different days of week. You should not respond to isolated BP readings , but rather the AVERAGE for that week

## 2012-01-14 NOTE — Telephone Encounter (Signed)
Pt called advised per hopp's note below pt understood & stated he would bring cuff & readings

## 2012-01-14 NOTE — Telephone Encounter (Signed)
Lmovm for pt to call office. °

## 2012-01-14 NOTE — Telephone Encounter (Signed)
Patient Information:  Caller Name: Keean  Phone: [phone]  Patient: Bradley Cole, Bradley Cole  Gender: Male  DOB: 1955/12/07  Age: 56 Years  PCP: Marga Melnick   Symptoms  Reason For Call & Symptoms: Patient states his B/P runs 130's/80.  Last Thursday, 01/10/12 awoke with ringing in his ears . +Headache. . Blood pressure 158/89.  Ringing has been intermittent but is still ongoing in Right ear.  Compliant with Blood pressure medication.  Patient exercises and on weight reduction program  Reviewed Health History In EMR: Yes  Reviewed Medications In EMR: Yes  Reviewed Allergies In EMR: Yes  Reviewed Surgeries / Procedures: No  Date of Onset of Symptoms: 01/10/2012  Guideline(s) Used:  High Blood Pressure  Disposition Per Guideline:   See Within 2 Weeks in Office  Reason For Disposition Reached:   BP > 140/90 and is taking BP medications  Advice Given:  General:  Untreated high blood pressure may cause damage to the heart, brain, kidneys, and eyes.  Treatment of high blood pressure can reduce the risk of stroke, heart attack, and heart failure.  The goal of blood pressure treatment for most patients with hypertension is to keep the blood pressure under 140/90.  BP less than 120 / 80   This is considered normal blood pressure  Call Back If:  Headache, blurred vision, difficulty talking, or difficulty walking occurs  Chest pain or difficulty breathing occurs  You want to go in to the office for a blood pressure check  You become worse.  Office Follow Up:  Does the office need to follow up with this patient?: Yes  Instructions For The Office: Patient has elevated blood pressure. Ringing in Right ear with headache. Available to see Dr. Alwyn Ren Mon-Wed. Please contact for appt. "Medication adjustment"  RN Note:  Patient is in town Monday, Tuesday and Wednesday of this  week and is available for appt.  evaluation. Please contact for elevated blood pressure appointment.  May leave a message  on his cell phone

## 2012-01-14 NOTE — Telephone Encounter (Signed)
Pt has appt scheduled for wed at 8 am is this ok.

## 2012-01-16 ENCOUNTER — Ambulatory Visit (INDEPENDENT_AMBULATORY_CARE_PROVIDER_SITE_OTHER): Payer: BC Managed Care – PPO | Admitting: Internal Medicine

## 2012-01-16 ENCOUNTER — Encounter: Payer: Self-pay | Admitting: Internal Medicine

## 2012-01-16 VITALS — BP 132/80 | HR 72 | Temp 98.1°F | Wt 237.2 lb

## 2012-01-16 DIAGNOSIS — E782 Mixed hyperlipidemia: Secondary | ICD-10-CM

## 2012-01-16 NOTE — Assessment & Plan Note (Signed)
It appears his cuff may be reading artificially high. Goals discussed. If the average is over 140/90; he should increase the Toprol-XL 50 mg one daily

## 2012-01-16 NOTE — Patient Instructions (Addendum)
Please  schedule fasting Labs pre CPX off Welchol :CBC & dif, BMET hepatic panel,,Lipids, TSH.PLEASE BRING THESE INSTRUCTIONS TO FOLLOW UP  LAB APPOINTMENT.This will guarantee correct labs are drawn, eliminating need for repeat blood sampling ( needle sticks ! ). Diagnoses /Codes: V70.0,401.9,272.4. Please take enteric-coated aspirin 81 mg daily with breakfast.  Blood Pressure Goal=  AVERAGE < 140/90 ;  Ideal is an AVERAGE < 135/85. This AVERAGE should be calculated from @ least 5-7 BP readings taken @ different times of day on different days of week. You should not respond to isolated BP readings , but rather the AVERAGE for that week   If you activate My Chart; the results can be released to you as soon as they populate from the lab. If you choose not to use this program; the labs have to be reviewed, copied & mailed   causing a delay in getting the results to you.

## 2012-01-16 NOTE — Progress Notes (Signed)
  Subjective:    Patient ID: Bradley Cole., male    DOB: Oct 05, 1955, 56 y.o.   MRN: [id number]  HPI Blood pressures with this cuff range from 125/81-160/90.  He has been taking the WelChol infrequently; the powder is costing $350 a month.  He exercises for at least 4 hours a week. He is on 1900-calorie weight reduction diet program. He does not specifically restrict salt    Review of Systems  He questions whether the Toprol-XL is causing excessive urination. He denies chest pain, palpitations, or dyspnea. He has intermittent cramps when walking.      Objective:   Physical Exam He appears healthy and well-nourished; he is in no acute distress  No carotid bruits are present.  Heart rhythm and rate are normal with no significant murmurs or gallops. S4. BP 138/88 with our cuff; 165/99 with his.  Chest is clear with no increased work of breathing  There is no evidence of aortic aneurysm or renal artery bruits  He has no clubbing or edema.   Pedal pulses are intact but DPP decreased  No ischemic skin changes are present         Assessment & Plan:

## 2012-01-17 ENCOUNTER — Encounter: Payer: BC Managed Care – PPO | Admitting: Internal Medicine

## 2012-02-07 ENCOUNTER — Other Ambulatory Visit: Payer: Self-pay | Admitting: Internal Medicine

## 2012-02-07 NOTE — Telephone Encounter (Signed)
#  90,R X 1 

## 2012-02-07 NOTE — Telephone Encounter (Signed)
Medication requested is not currently on med list. I called patient to clarify and he stated he is taking Benazepril.  Hopp please approve medication to be added back to med list (not sure when/why it was removed), not noted (may be system error).

## 2012-02-18 ENCOUNTER — Other Ambulatory Visit (INDEPENDENT_AMBULATORY_CARE_PROVIDER_SITE_OTHER): Payer: BC Managed Care – PPO

## 2012-02-18 DIAGNOSIS — E785 Hyperlipidemia, unspecified: Secondary | ICD-10-CM

## 2012-02-18 DIAGNOSIS — Z0000 Encounter for general adult medical examination without abnormal findings: Secondary | ICD-10-CM

## 2012-02-18 DIAGNOSIS — I10 Essential (primary) hypertension: Secondary | ICD-10-CM

## 2012-02-19 LAB — CBC WITH DIFFERENTIAL/PLATELET
Basophils Absolute: 0 10*3/uL (ref 0.0–0.1)
Eosinophils Relative: 2.5 % (ref 0.0–5.0)
HCT: 41.8 % (ref 39.0–52.0)
Hemoglobin: 14.2 g/dL (ref 13.0–17.0)
Lymphocytes Relative: 26.3 % (ref 12.0–46.0)
Monocytes Relative: 6.5 % (ref 3.0–12.0)
Platelets: 259 10*3/uL (ref 150.0–400.0)
RDW: 13.2 % (ref 11.5–14.6)
WBC: 5.6 10*3/uL (ref 4.5–10.5)

## 2012-02-19 LAB — HEPATIC FUNCTION PANEL
ALT: 24 U/L (ref 0–53)
AST: 24 U/L (ref 0–37)
Albumin: 4 g/dL (ref 3.5–5.2)
Alkaline Phosphatase: 61 U/L (ref 39–117)
Bilirubin, Direct: 0.1 mg/dL (ref 0.0–0.3)
Total Protein: 7.3 g/dL (ref 6.0–8.3)

## 2012-02-19 LAB — BASIC METABOLIC PANEL
CO2: 26 mEq/L (ref 19–32)
Chloride: 104 mEq/L (ref 96–112)
Glucose, Bld: 72 mg/dL (ref 70–99)
Sodium: 137 mEq/L (ref 135–145)

## 2012-02-19 LAB — LIPID PANEL
Cholesterol: 199 mg/dL (ref 0–200)
VLDL: 27 mg/dL (ref 0.0–40.0)

## 2012-02-20 ENCOUNTER — Other Ambulatory Visit: Payer: BC Managed Care – PPO

## 2012-03-03 ENCOUNTER — Ambulatory Visit (INDEPENDENT_AMBULATORY_CARE_PROVIDER_SITE_OTHER): Payer: BC Managed Care – PPO | Admitting: Internal Medicine

## 2012-03-03 ENCOUNTER — Encounter: Payer: Self-pay | Admitting: Internal Medicine

## 2012-03-03 VITALS — BP 132/80 | HR 75 | Temp 98.1°F | Resp 14 | Ht 75.0 in | Wt 237.0 lb

## 2012-03-03 DIAGNOSIS — Z23 Encounter for immunization: Secondary | ICD-10-CM

## 2012-03-03 DIAGNOSIS — Z0000 Encounter for general adult medical examination without abnormal findings: Secondary | ICD-10-CM

## 2012-03-03 MED ORDER — ROSUVASTATIN CALCIUM 20 MG PO TABS: ORAL_TABLET | ORAL | Status: DC

## 2012-03-03 NOTE — Patient Instructions (Addendum)
Minimal Blood Pressure Goal= AVERAGE < 140/90;  Ideal is an AVERAGE < 135/85. This AVERAGE should be calculated from @ least 5-7 BP readings taken @ different times of day on different days of week. You should not respond to isolated BP readings , but rather the AVERAGE for that week .Please bring your  blood pressure cuff to office visits to verify that it is reliable.It  can also be checked against the blood pressure device at the pharmacy. Finger or wrist cuffs are not dependable; an arm cuff is.  Please take enteric-coated aspirin 81 mg daily with breakfast.  Please  schedule fasting Labs after 12 weeks of Crestor & weight loss program: CK,Lipids, AST,ALT. PLEASE BRING THESE INSTRUCTIONS TO FOLLOW UP  LAB APPOINTMENT.This will guarantee correct labs are drawn, eliminating need for repeat blood sampling ( needle sticks ! ). Diagnoses /Codes: 272.4,995.20.  If you activate My Chart; the results can be released to you as soon as they populate from the lab. If you choose not to use this program; the labs have to be reviewed, copied & mailed   causing a delay in getting the results to you.

## 2012-03-03 NOTE — Progress Notes (Signed)
  Subjective:    Patient ID: Bradley Gauss., male    DOB: 03/31/1955, 57 y.o.   MRN: [id number]  HPI  Bradley Cole is here for a physical; he denies acute issues.      Review of Systems He is on a heart healthy diet; he exercises 60 minutes 4-5 times per week without symptoms. Specifically he denies chest pain, palpitations, dyspnea, or claudication. Family history is negative for premature coronary disease. Advanced cholesterol testing reveals his LDL goal was less than 100, ideally < 70.         Physical Exam Gen.: Healthy and well-nourished in appearance. Alert, appropriate and cooperative throughout exam.   Head: Normocephalic without obvious abnormalities; no alopecia  Eyes: No corneal or conjunctival inflammation noted. Pupils equal round reactive to light and accommodation. Fundal exam is benign without hemorrhages, exudate, papilledema. Extraocular motion intact. Vision grossly normal. Ears: External  ear exam reveals no significant lesions or deformities. Canals clear .TMs normal. Hearing is grossly normal bilaterally. Nose: External nasal exam reveals no deformity or inflammation. Nasal mucosa are pink and moist. No lesions or exudates noted.  Mouth: Oral mucosa and oropharynx reveal no lesions or exudates. Teeth in good repair. Neck: No deformities, masses, or tenderness noted. Range of motion & Thyroid normal. Lungs: Normal respiratory effort; chest expands symmetrically. Lungs are clear to auscultation without rales, wheezes, or increased work of breathing. Heart: Normal rate and rhythm. Normal S1 and S2. No gallop, click, or rub. No  murmur. Abdomen: Bowel sounds normal; abdomen soft and nontender. No masses, organomegaly or hernias noted. Genitalia: Genitalia normal except for left varices. Prostate is normal without enlargement, asymmetry, nodularity, or induration.                                    Musculoskeletal/extremities: No deformity or scoliosis noted of  the  thoracic or lumbar spine. No clubbing, cyanosis, edema, or significant extremity  deformity noted. Range of motion normal .Tone & strength  normal.Joints normal . Nail health good. Able to lie down & sit up w/o help. Negative SLR bilaterally Vascular: Carotid, radial artery, dorsalis pedis and  posterior tibial pulses are full and equal. No bruits present. Neurologic: Alert and oriented x3. Deep tendon reflexes symmetrical and normal. Gait  normal.        Skin: Intact without suspicious lesions or rashes. Lymph: No cervical, axillary, or inguinal lymphadenopathy present. Psych: Mood and affect are normal. Normally interactive                                                                                        Assessment & Plan:  #1 comprehensive physical exam; no acute findings #2 trial of low dose Crestor ;ie 20 mg every  Monday Plan: see Orders

## 2012-05-22 ENCOUNTER — Other Ambulatory Visit: Payer: Self-pay | Admitting: Internal Medicine

## 2012-09-07 ENCOUNTER — Other Ambulatory Visit: Payer: Self-pay | Admitting: Internal Medicine

## 2012-12-12 ENCOUNTER — Other Ambulatory Visit: Payer: Self-pay | Admitting: *Deleted

## 2012-12-12 ENCOUNTER — Telehealth: Payer: Self-pay | Admitting: *Deleted

## 2012-12-12 DIAGNOSIS — E785 Hyperlipidemia, unspecified: Secondary | ICD-10-CM

## 2012-12-12 DIAGNOSIS — T887XXA Unspecified adverse effect of drug or medicament, initial encounter: Secondary | ICD-10-CM

## 2012-12-12 NOTE — Telephone Encounter (Signed)
Patient called and requested refills for his Crestor. Patient was supposed to have lab work done earlier this year, but did not. Lab orders for CK, Lipid Panel, AST and ALT were entered into Epic. Patient is going to call back today to make this appt.

## 2012-12-19 ENCOUNTER — Encounter: Payer: Self-pay | Admitting: *Deleted

## 2012-12-19 ENCOUNTER — Other Ambulatory Visit (INDEPENDENT_AMBULATORY_CARE_PROVIDER_SITE_OTHER): Payer: BC Managed Care – PPO

## 2012-12-19 DIAGNOSIS — E785 Hyperlipidemia, unspecified: Secondary | ICD-10-CM

## 2012-12-19 DIAGNOSIS — T887XXA Unspecified adverse effect of drug or medicament, initial encounter: Secondary | ICD-10-CM

## 2012-12-19 LAB — LIPID PANEL
Cholesterol: 192 mg/dL (ref 0–200)
HDL: 43.9 mg/dL (ref >39.00)
LDL Cholesterol: 125 mg/dL — ABNORMAL HIGH (ref 0–99)
Total CHOL/HDL Ratio: 4

## 2012-12-19 LAB — CK: Total CK: 181 U/L (ref 7–232)

## 2012-12-19 LAB — ALT: ALT: 27 U/L (ref 0–53)

## 2012-12-19 LAB — AST: AST: 20 U/L (ref 0–37)

## 2013-01-09 ENCOUNTER — Other Ambulatory Visit: Payer: Self-pay

## 2013-01-09 MED ORDER — ROSUVASTATIN CALCIUM 20 MG PO TABS: ORAL_TABLET | ORAL | Status: DC

## 2013-01-09 NOTE — Telephone Encounter (Signed)
Patient has been made aware and the medication has been faxed       KP

## 2013-01-09 NOTE — Telephone Encounter (Signed)
Try Crestor M, W , & F with fasting lipids, LFT, CK after 10-12 weeks

## 2013-01-09 NOTE — Addendum Note (Signed)
Addended by: Arnette Norris on: 01/09/2013 05:06 PM   Modules accepted: Orders

## 2013-01-09 NOTE — Telephone Encounter (Signed)
Msg from patient which stated herr was sent his lab results and wanted to know if he should take the Cholesterol medication. Reviewed the chart:  Notes Recorded by Pecola Lawless, MD on 12/19/2012 at 3:52 PM Risk of premature heart attack or stroke increases as LDL or BAD cholesterol rises, especially if there is family history of heart attack in males before 45 or women before 70. Based on your prior advanced testing, your LDL goal is < 100 , ideally < 70. Your present LDL increases long term heart attack or stroke risk 25 %.The best dietary information on cholesterol is Dr Gildardo Griffes book Eat, Drink & Be Healthy.Cardiovascular exercise, this can be as simple a program as walking, is recommended 30-45 minutes 3-4 times per week. If you're not exercising you should take 6-8 weeks to build up to this level. If you cannot make significant changes in exercise and nutrition; increasing the low dose Crestor would be the best therapeutic option to reduce your long term risk. CK is an enzyme released with any muscle injury or overuse; it & the liver studies are normal. Hopp   Hop would you like to change the directions on the Crestor to daily? Please advise      KP

## 2013-03-24 ENCOUNTER — Other Ambulatory Visit: Payer: Self-pay | Admitting: Internal Medicine

## 2013-03-25 NOTE — Telephone Encounter (Signed)
Rx sent to the pharmacy by e-script.  Pt needs complete physical.//AB/CMA 

## 2013-04-29 ENCOUNTER — Other Ambulatory Visit: Payer: Self-pay | Admitting: Internal Medicine

## 2013-04-30 ENCOUNTER — Other Ambulatory Visit: Payer: Self-pay | Admitting: Internal Medicine

## 2013-04-30 ENCOUNTER — Telehealth: Payer: Self-pay | Admitting: Internal Medicine

## 2013-04-30 DIAGNOSIS — I10 Essential (primary) hypertension: Secondary | ICD-10-CM

## 2013-04-30 DIAGNOSIS — Z0000 Encounter for general adult medical examination without abnormal findings: Secondary | ICD-10-CM

## 2013-04-30 MED ORDER — BENAZEPRIL HCL 40 MG PO TABS: ORAL_TABLET | ORAL | Status: DC

## 2013-04-30 NOTE — Telephone Encounter (Signed)
Pt request lab work prior to his cpe on 05/06/13. Pt also request for Dr. Linna Darner to send in Benazepril to last him until he comes in for the appt. Please call pt

## 2013-05-01 NOTE — Telephone Encounter (Signed)
LMOM (3:56pm) informing the pt that labs have been ordered and he can come and get his bloodwork done when he can, and also his med refill request have been done.//AB/CMA

## 2013-05-06 ENCOUNTER — Encounter: Payer: Self-pay | Admitting: Internal Medicine

## 2013-05-06 ENCOUNTER — Ambulatory Visit (INDEPENDENT_AMBULATORY_CARE_PROVIDER_SITE_OTHER): Payer: BC Managed Care – PPO | Admitting: Internal Medicine

## 2013-05-06 ENCOUNTER — Other Ambulatory Visit (INDEPENDENT_AMBULATORY_CARE_PROVIDER_SITE_OTHER): Payer: BC Managed Care – PPO

## 2013-05-06 VITALS — BP 150/88 | HR 68 | Temp 97.9°F | Resp 14 | Ht 74.5 in | Wt 240.2 lb

## 2013-05-06 DIAGNOSIS — I10 Essential (primary) hypertension: Secondary | ICD-10-CM

## 2013-05-06 DIAGNOSIS — Z862 Personal history of diseases of the blood and blood-forming organs and certain disorders involving the immune mechanism: Secondary | ICD-10-CM

## 2013-05-06 DIAGNOSIS — E782 Mixed hyperlipidemia: Secondary | ICD-10-CM

## 2013-05-06 DIAGNOSIS — Z8639 Personal history of other endocrine, nutritional and metabolic disease: Secondary | ICD-10-CM

## 2013-05-06 DIAGNOSIS — Z0000 Encounter for general adult medical examination without abnormal findings: Secondary | ICD-10-CM

## 2013-05-06 DIAGNOSIS — Z8739 Personal history of other diseases of the musculoskeletal system and connective tissue: Secondary | ICD-10-CM

## 2013-05-06 LAB — LIPID PANEL
Cholesterol: 143 mg/dL (ref 0–200)
HDL: 46.1 mg/dL (ref >39.00)
LDL Cholesterol: 79 mg/dL (ref 0–99)
Total CHOL/HDL Ratio: 3
Triglycerides: 90 mg/dL (ref 0.0–149.0)
VLDL: 18 mg/dL (ref 0.0–40.0)

## 2013-05-06 LAB — HEPATIC FUNCTION PANEL
ALT: 35 U/L (ref 0–53)
AST: 24 U/L (ref 0–37)
Albumin: 4.3 g/dL (ref 3.5–5.2)
Alkaline Phosphatase: 56 U/L (ref 39–117)
BILIRUBIN TOTAL: 0.9 mg/dL (ref 0.3–1.2)
Bilirubin, Direct: 0.1 mg/dL (ref 0.0–0.3)
Total Protein: 7.1 g/dL (ref 6.0–8.3)

## 2013-05-06 LAB — CBC WITH DIFFERENTIAL/PLATELET
Basophils Absolute: 0 10*3/uL (ref 0.0–0.1)
Basophils Relative: 0.2 % (ref 0.0–3.0)
Eosinophils Absolute: 0.1 10*3/uL (ref 0.0–0.7)
Eosinophils Relative: 2.4 % (ref 0.0–5.0)
HCT: 40 % (ref 39.0–52.0)
Hemoglobin: 13.6 g/dL (ref 13.0–17.0)
Lymphocytes Relative: 24.5 % (ref 12.0–46.0)
Lymphs Abs: 1.5 10*3/uL (ref 0.7–4.0)
MCHC: 34 g/dL (ref 30.0–36.0)
MCV: 88.5 fl (ref 78.0–100.0)
Monocytes Absolute: 0.5 10*3/uL (ref 0.1–1.0)
Monocytes Relative: 8.7 % (ref 3.0–12.0)
Neutro Abs: 4 10*3/uL (ref 1.4–7.7)
Neutrophils Relative %: 64.2 % (ref 43.0–77.0)
Platelets: 258 10*3/uL (ref 150.0–400.0)
RBC: 4.52 Mil/uL (ref 4.22–5.81)
RDW: 13.2 % (ref 11.5–14.6)
WBC: 6.3 10*3/uL (ref 4.5–10.5)

## 2013-05-06 LAB — BASIC METABOLIC PANEL
BUN: 18 mg/dL (ref 6–23)
CALCIUM: 9 mg/dL (ref 8.4–10.5)
CO2: 27 mEq/L (ref 19–32)
Chloride: 104 mEq/L (ref 96–112)
Creatinine, Ser: 1.1 mg/dL (ref 0.4–1.5)
GFR: 74.59 mL/min (ref >60.00)
GLUCOSE: 90 mg/dL (ref 70–99)
POTASSIUM: 4.3 meq/L (ref 3.5–5.1)
Sodium: 139 mEq/L (ref 135–145)

## 2013-05-06 LAB — TSH: TSH: 1.61 u[IU]/mL (ref 0.35–5.50)

## 2013-05-06 NOTE — Progress Notes (Signed)
Pre visit review using our clinic review tool, if applicable. No additional management support is needed unless otherwise documented below in the visit note. 

## 2013-05-06 NOTE — Progress Notes (Signed)
   Subjective:    Patient ID: Bradley Kluver., male    DOB: 11-14-1955, 58 y.o.   MRN: [id number]  HPI  He is here for a physical;acute issues include back pain responding to massage, stretch & NSAIDS.     Review of Systems A heart healthy diet is followed; exercise encompasses 60 minutes 3-4  times per week as CVE & walking without symptoms.  Family history is neg for premature coronary disease. Advanced cholesterol testing reveals  LDL goal is less than 100 ; ideally < 70 . There is medication compliance with the statin.  Low dose ASA not taken Specifically denied are  chest pain, palpitations, dyspnea, or claudication.  Significant abdominal symptoms, memory deficit, or myalgias not present. BP not monitored @ home; when not in pain 134/high 80s typically.     Objective:   Physical Exam Gen.: Healthy and well-nourished in appearance. Alert, appropriate and cooperative throughout exam. Appears younger than stated age  Head: Normocephalic without obvious abnormalities  Eyes: No corneal or conjunctival inflammation noted. Pupils equal round reactive to light and accommodation. Extraocular motion intact. Minor non sustained nystagmus Ears: External  ear exam reveals no significant lesions or deformities. Canals clear .TMs normal. Hearing is grossly normal bilaterally. Nose: External nasal exam reveals no deformity or inflammation. Nasal mucosa are pink and moist. No lesions or exudates noted.   Mouth: Oral mucosa and oropharynx reveal no lesions or exudates. Teeth in good repair. Neck: No deformities, masses, or tenderness noted. Range of motion & Thyroid normal Lungs: Normal respiratory effort; chest expands symmetrically. Lungs are clear to auscultation without rales, wheezes, or increased work of breathing. Heart: Slow  rate and regular rhythm. Normal S1 and S2. No gallop, click, or rub. No murmur. Abdomen: Bowel sounds normal; abdomen soft and nontender. No masses, organomegaly  or hernias noted. Genitalia: Genitalia normal except for left varices. Prostate is normal without enlargement, asymmetry, nodularity, or induration                                   Musculoskeletal/extremities: No deformity or scoliosis noted of  the thoracic or lumbar spine.   No clubbing, cyanosis, edema, or significant extremity  deformity noted. Range of motion normal .Tone & strength normal. Hand joints normal Fingernail  health good. Able to lie down & sit up w/o help but classic low back crawl. Negative SLR bilaterally Vascular: Carotid, radial artery, dorsalis pedis and  posterior tibial pulses are full and equal. No bruits present. Neurologic: Alert and oriented x3. Deep tendon reflexes symmetrical and normal.  Gait normal   Skin: Intact without suspicious lesions or rashes. Lymph: No cervical, axillary, or inguinal lymphadenopathy present. Psych: Mood and affect are normal. Normally interactive                                                                                        Assessment & Plan:  #1 comprehensive physical exam; no acute findings #2 LBP, medication declined  Plan: see Orders  & Recommendations

## 2013-05-06 NOTE — Patient Instructions (Signed)

## 2013-05-11 ENCOUNTER — Encounter: Payer: Self-pay | Admitting: *Deleted

## 2013-06-05 ENCOUNTER — Other Ambulatory Visit: Payer: Self-pay | Admitting: Internal Medicine

## 2013-06-14 ENCOUNTER — Encounter: Payer: Self-pay | Admitting: Internal Medicine

## 2013-06-14 ENCOUNTER — Other Ambulatory Visit: Payer: Self-pay | Admitting: Internal Medicine

## 2013-07-02 ENCOUNTER — Other Ambulatory Visit: Payer: Self-pay | Admitting: Internal Medicine

## 2013-08-03 ENCOUNTER — Other Ambulatory Visit: Payer: Self-pay

## 2013-08-03 MED ORDER — BENAZEPRIL HCL 40 MG PO TABS: ORAL_TABLET | ORAL | Status: DC

## 2013-11-26 ENCOUNTER — Other Ambulatory Visit: Payer: Self-pay | Admitting: Internal Medicine

## 2014-01-26 ENCOUNTER — Ambulatory Visit (INDEPENDENT_AMBULATORY_CARE_PROVIDER_SITE_OTHER): Payer: BC Managed Care – PPO | Admitting: Family Medicine

## 2014-01-26 VITALS — BP 178/100 | HR 65 | Temp 97.9°F | Resp 18 | Ht 75.5 in | Wt 244.4 lb

## 2014-01-26 DIAGNOSIS — IMO0001 Reserved for inherently not codable concepts without codable children: Secondary | ICD-10-CM

## 2014-01-26 DIAGNOSIS — R030 Elevated blood-pressure reading, without diagnosis of hypertension: Secondary | ICD-10-CM

## 2014-01-26 DIAGNOSIS — J029 Acute pharyngitis, unspecified: Secondary | ICD-10-CM

## 2014-01-26 LAB — POCT RAPID STREP A (OFFICE): RAPID STREP A SCREEN: NEGATIVE

## 2014-01-26 MED ORDER — AZITHROMYCIN 250 MG PO TABS: ORAL_TABLET | ORAL | Status: DC

## 2014-01-26 MED ORDER — FIRST-DUKES MOUTHWASH MT SUSP: 5.0000 mL | Freq: Four times a day (QID) | OROMUCOSAL | Status: DC | PRN

## 2014-01-26 NOTE — Patient Instructions (Signed)
It appears that you have a viral infection- your uvula has ulcers which are usually caused by a virus.  Please use ibuprofen, tylenol and throat lozenges/ spray as needed.  You might also try the rx numbing mouthwash. If you are not getting better in the next few days give me a call.  Take the azithromycin rx to hang on to- as above, I think that you have a virus which will not respond to abx.  However please call me to discuss further if your symptoms persist.    Your BP is high today.  Please keep an eye on it and follow-up with your regular doctor for a recheck after the holidays.    BP Readings from Last 3 Encounters:  01/26/14 164/90  05/06/13 150/88  03/03/12 132/80

## 2014-01-26 NOTE — Progress Notes (Addendum)
[facility] [address], [place] [address] [phone]  Date:  01/26/2014   Name:  Bradley Cole.   DOB:  1955/03/26   MRN:  [id number]  PCP:  Bradley Cobble, MD    Chief Complaint: Sore Throat and Ear Pain   History of Present Illness:  Bradley Cole. is a 58 y.o. very pleasant male patient who presents with the following:  Here today with illness.  He is here today with a ST, ear pain and pressure.  Both ears are sore to him, feel "like pressure."  He has had the ST for about 4 days now.  Mild cough, not a lot.   He has not noted any fever, chills or aches.   [place] No GI symptoms His PCP had mentioned his BP to him a few months ago; admits to several life stressors and weight gain recently.    Patient Active Problem List   Diagnosis Date Noted  . DDD (degenerative disc disease)   . Hx of gout 12/30/2007  . HYPERTENSION 12/30/2007  . HYPERLIPIDEMIA 11/19/2006    Past Medical History  Diagnosis Date  . DDD (degenerative disc disease)   . Hyperlipidemia     LDL goal < 100, ideally < 70  . Hypertension     Past Surgical History  Procedure Laterality Date  . Knee surgery      R knee torn ligament  . Colonoscopy  2007    negative, [facility]    History  Substance Use Topics  . Smoking status: Current Some Day Smoker    Types: Cigars    Last Attempt to Quit: 11/07/2010  . Smokeless tobacco: Current User    Types: Chew     Comment: rarely chews (Duck season Nov-mid January 2 days / month ); 2 cigars / month or less  . Alcohol Use: 1.8 oz/week    3 Glasses of wine per week     Comment: socially     Family History  Problem Relation Age of Onset  . Hypertension Father   . Mitral valve prolapse Father   . Lymphoma Father     mantel cell  . Glaucoma Father   . Macular degeneration Father   . Hypertension Mother   . Heart disease Mother     valvular heart disease   . Heart attack Mother     based on findings @ valve replacement  . Heart attack Maternal Grandfather     in 110s  . Diabetes Paternal Grandmother   . Hypertension Paternal Grandmother   . Stroke Neg Hx     Allergies  Allergen Reactions  . Penicillins     Rash @ 14; he has had Ampicillin since w/o adverse  effect    Medication list has been reviewed and updated.  Current Outpatient Prescriptions on File Prior to Visit  Medication Sig Dispense Refill  . benazepril (LOTENSIN) 40 MG tablet TAKE 1 TABLET BY  MOUTH ONCE DAILY. 90 tablet 3  . CRESTOR 20 MG tablet take 1 tablet by mouth ON MONDAY WEDNESDAY AND FRIDAY 21 tablet 2  . Multiple Vitamins-Minerals (MEGA MULTI MEN PO) Take by mouth 2 (two) times daily.      . TOPROL XL 50 MG 24 hr tablet Take 1 tablet (50 mg total) by mouth daily. Take with or immediately following a meal. 90 tablet 2  . TOPROL XL 50  MG 24 hr tablet take 1/2 to 1 tablet by mouth once daily TO CONTROL BP 130/85 ON AVERAGE 90 tablet 2   No current facility-administered medications on file prior to visit.    Review of Systems:  As per HPI- otherwise negative.   Physical Examination: Filed Vitals:   01/26/14 0811  BP: 164/90  Pulse: 65  Temp: 97.9 F (36.6 C)  Resp: 18   Filed Vitals:   01/26/14 0811  Height: 6' 3.5" (1.918 m)  Weight: 244 lb 6.4 oz (110.859 kg)   Body mass index is 30.14 kg/(m^2). Ideal Body Weight: Weight in (lb) to have BMI = 25: 202.3  GEN: WDWN, NAD, Non-toxic, A & O x 3, looks well HEENT: Atraumatic, Normocephalic. Neck supple. No masses, No LAD.  Bilateral TM wnl, oropharynx normal.  PEERL,EOMI.   Ulcers on uvula Ears and Nose: No external deformity. CV: RRR, No M/G/R. No JVD. No thrill. No extra heart sounds. PULM: CTA B, no wheezes, crackles, rhonchi. No retractions. No resp. distress. No accessory muscle use. EXTR: No c/c/e NEURO Normal gait.  PSYCH: Normally interactive. Conversant. Not depressed or anxious  appearing.  Calm demeanor.   Results for orders placed or performed in visit on 01/26/14  POCT rapid strep A  Result Value Ref Range   Rapid Strep A Screen Negative Negative     Assessment and Plan: Acute pharyngitis, unspecified pharyngitis type - Plan: POCT rapid strep A, Throat culture ([facility]), azithromycin (ZITHROMAX) 250 MG tablet  Elevated BP  Counseled regarding likely viral etiology of his ST given the negative strep and ulcers on the uvula.  Encouraged OTC medications as needed, he may use [facility] also As holidays are upon [place] gave paper rx of azithromycin to hold- he will call if he is not getting better soon and will use this if appropriate He is aware of his BP- will take at home and record and contact his PCP in a couple of weeks to discuss further if he continues to run high.    Signed Lamar Blinks, MD  Called 12/24: LMOM that culture is negative.  Call me if not better  Results for orders placed or performed in visit on 01/26/14  Throat culture [facility])  Result Value Ref Range   Organism ID, Bacteria Normal Upper Respiratory Flora    Organism ID, Bacteria No Beta Hemolytic Streptococci Isolated   POCT rapid strep A  Result Value Ref Range   Rapid Strep A Screen Negative Negative

## 2014-01-28 LAB — CULTURE, GROUP A STREP: ORGANISM ID, BACTERIA: NORMAL

## 2014-04-02 ENCOUNTER — Other Ambulatory Visit: Payer: Self-pay | Admitting: Internal Medicine

## 2014-06-01 ENCOUNTER — Other Ambulatory Visit: Payer: Self-pay

## 2014-06-01 ENCOUNTER — Other Ambulatory Visit: Payer: Self-pay | Admitting: Internal Medicine

## 2014-06-01 DIAGNOSIS — E782 Mixed hyperlipidemia: Secondary | ICD-10-CM

## 2014-06-01 DIAGNOSIS — Z8739 Personal history of other diseases of the musculoskeletal system and connective tissue: Secondary | ICD-10-CM

## 2014-06-01 DIAGNOSIS — I10 Essential (primary) hypertension: Secondary | ICD-10-CM

## 2014-06-01 MED ORDER — ROSUVASTATIN CALCIUM 20 MG PO TABS: ORAL_TABLET | ORAL | Status: DC

## 2014-06-25 ENCOUNTER — Other Ambulatory Visit: Payer: Self-pay | Admitting: Internal Medicine

## 2014-06-28 ENCOUNTER — Other Ambulatory Visit: Payer: Self-pay

## 2014-06-28 ENCOUNTER — Telehealth: Payer: Self-pay | Admitting: Internal Medicine

## 2014-06-28 MED ORDER — BENAZEPRIL HCL 40 MG PO TABS: ORAL_TABLET | ORAL | Status: DC

## 2014-06-28 MED ORDER — TOPROL XL 50 MG PO TB24: ORAL_TABLET | ORAL | Status: DC

## 2014-06-28 NOTE — Telephone Encounter (Signed)
Left message with wife that bp med refills have been sent---ordered 15 tab, enough meds until he is seen on 6/1

## 2014-06-28 NOTE — Telephone Encounter (Signed)
Pt called in said he is completely out of his BP meds .  He setup a CPE for 6/1.  He would like to know if we can call in enough BP meds to make it to his appt?  He is completely out. [facility]   Pt would like to put orders in for Blood work.  He wants to come the morning of his appt to get labs done

## 2014-07-06 ENCOUNTER — Telehealth: Payer: Self-pay | Admitting: Internal Medicine

## 2014-07-06 DIAGNOSIS — I10 Essential (primary) hypertension: Secondary | ICD-10-CM

## 2014-07-06 NOTE — Telephone Encounter (Signed)
Patient has an appointment tomorrow at 1:30 and he was wanting to come in the morning to do lab work, but his lab orders expire today. Can we put another order in for tomorrow or do you want to just do it after appointment. Please advise.

## 2014-07-06 NOTE — Telephone Encounter (Signed)
All labs re-entered with future date per dr hopper is ok

## 2014-07-06 NOTE — Telephone Encounter (Signed)
Change in date OK

## 2014-07-07 ENCOUNTER — Encounter: Payer: Self-pay | Admitting: Internal Medicine

## 2014-07-07 ENCOUNTER — Ambulatory Visit (INDEPENDENT_AMBULATORY_CARE_PROVIDER_SITE_OTHER): Payer: BLUE CROSS/BLUE SHIELD | Admitting: Internal Medicine

## 2014-07-07 VITALS — BP 130/82 | HR 76 | Temp 97.9°F | Resp 12 | Wt 243.0 lb

## 2014-07-07 DIAGNOSIS — M21611 Bunion of right foot: Secondary | ICD-10-CM

## 2014-07-07 DIAGNOSIS — M2011 Hallux valgus (acquired), right foot: Secondary | ICD-10-CM | POA: Diagnosis not present

## 2014-07-07 DIAGNOSIS — I10 Essential (primary) hypertension: Secondary | ICD-10-CM

## 2014-07-07 DIAGNOSIS — Z0000 Encounter for general adult medical examination without abnormal findings: Secondary | ICD-10-CM

## 2014-07-07 DIAGNOSIS — H9313 Tinnitus, bilateral: Secondary | ICD-10-CM | POA: Diagnosis not present

## 2014-07-07 MED ORDER — TOPROL XL 50 MG PO TB24: ORAL_TABLET | ORAL | Status: DC

## 2014-07-07 NOTE — Progress Notes (Signed)
Subjective:    Patient ID: Bradley Kluver., male    DOB: Feb 22, 1955, 58 y.o.   MRN: [id number]  HPI He is here for a physical;acute issues include pain in the feet greater in the right lateral foot than the left.  He has tinnitus which is variable in location and intermittent. 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  He is on a modified heart healthy diet; he does eat salt. He exercises 3-5 hours per week without cardiopulmonary symptoms.  He smokes 4 cigars per month during hunting season. He has stopped chewing tobacco.  Colonoscopy was negative in 2007. He has no active GI symptoms  He did have a gout attack in January of this year. This resolves if he drinks a quart of black cherry juice.   Review of Systems  Chest pain, palpitations, tachycardia, exertional dyspnea, paroxysmal nocturnal dyspnea, claudication or edema are absent. No unexplained weight loss, abdominal pain, significant dyspepsia, dysphagia, melena, rectal bleeding, or persistently small caliber stools. Dysuria, pyuria, hematuria, frequency, nocturia or polyuria are denied. Change in hair, skin, nails denied. No bowel changes of constipation or diarrhea. No intolerance to heat or cold.                                                                               Objective:   Physical Exam  Gen.: Adequately nourished in appearance. Alert, appropriate and cooperative throughout exam. BMI:29.97 Appears younger than stated age  Head: Normocephalic without obvious abnormalities  Eyes: No corneal or conjunctival inflammation noted. Pupils equal round reactive to light and accommodation. Extraocular motion intact.  Ears: External  ear exam reveals no significant lesions or deformities. Canals clear .TMs normal. Hearing is grossly normal bilaterally. Asymmetry of tuning fork perception; no localization with the tuning fork held against the forehead Nose:  External nasal exam reveals no deformity or inflammation. Nasal mucosa are pink and moist. No lesions or exudates noted.   Mouth: Oral mucosa and oropharynx reveal no lesions or exudates. Teeth in good repair. Neck: No deformities, masses, or tenderness noted. Range of motion & Thyroid normal. Lungs: Normal respiratory effort; chest expands symmetrically. Lungs are clear to auscultation without rales, wheezes, or increased work of breathing. Heart: Normal rate and rhythm. Normal S1 and S2. No gallop, click, or rub. No murmur. Abdomen: Bowel sounds normal; abdomen soft and nontender. No masses, organomegaly or hernias noted. Genitalia: Genitalia normal except for left varices & granuloma. Prostate is normal without enlargement, asymmetry, nodularity, or induration                                    Musculoskeletal/extremities: No deformity or scoliosis noted of  the thoracic or lumbar spine.. No clubbing, cyanosis, edema, or significant extremity  deformity noted.  Range of motion normal . Tone & strength normal. Hand joints normal  Fingernail  health good. Bunion at the base of the fifth toe, larger on the right than the left. Able to lie down & sit up w/o help.  Negative SLR bilaterally Vascular: Carotid, radial artery,  dorsalis pedis and  posterior tibial pulses are full and equal. No bruits present. Neurologic: Alert and oriented x3. Deep tendon reflexes symmetrical and normal.  Gait normal     Skin: Intact without suspicious lesions or rashes. Lymph: No cervical, axillary, or inguinal lymphadenopathy present. Psych: Mood and affect are normal. 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

## 2014-07-07 NOTE — Patient Instructions (Signed)
Minimal Blood Pressure Goal= AVERAGE < 140/90;  Ideal is an AVERAGE < 135/85. This AVERAGE should be calculated from @ least 5-7 BP readings taken @ different times of day on different days of week. You should not respond to isolated BP readings , but rather the AVERAGE for that week .Please bring your  blood pressure cuff to office visits to verify that it is reliable.It  can also be checked against the blood pressure device at the pharmacy. Finger or wrist cuffs are not dependable; an arm cuff is. Your next office appointment will be determined based upon review of your pending labs. Those written interpretation of the lab results and instructions will be transmitted to you by mail for your records.  Critical results will be called.   Followup as needed for any active or acute issue. Please report any significant change in your symptoms. 

## 2014-07-07 NOTE — Progress Notes (Signed)
Pre visit review using our clinic review tool, if applicable. No additional management support is needed unless otherwise documented below in the visit note. 

## 2014-07-09 ENCOUNTER — Other Ambulatory Visit (INDEPENDENT_AMBULATORY_CARE_PROVIDER_SITE_OTHER): Payer: BLUE CROSS/BLUE SHIELD

## 2014-07-09 ENCOUNTER — Other Ambulatory Visit: Payer: Self-pay | Admitting: Internal Medicine

## 2014-07-09 DIAGNOSIS — Z0189 Encounter for other specified special examinations: Secondary | ICD-10-CM | POA: Diagnosis not present

## 2014-07-09 DIAGNOSIS — Z0000 Encounter for general adult medical examination without abnormal findings: Secondary | ICD-10-CM

## 2014-07-09 LAB — HEPATIC FUNCTION PANEL
ALT: 30 U/L (ref 0–53)
AST: 21 U/L (ref 0–37)
Albumin: 4.4 g/dL (ref 3.5–5.2)
Alkaline Phosphatase: 78 U/L (ref 39–117)
BILIRUBIN TOTAL: 0.6 mg/dL (ref 0.2–1.2)
Bilirubin, Direct: 0.1 mg/dL (ref 0.0–0.3)
TOTAL PROTEIN: 7.5 g/dL (ref 6.0–8.3)

## 2014-07-09 LAB — CBC WITH DIFFERENTIAL/PLATELET
Basophils Absolute: 0 10*3/uL (ref 0.0–0.1)
Basophils Relative: 0.2 % (ref 0.0–3.0)
Eosinophils Absolute: 0.2 10*3/uL (ref 0.0–0.7)
Eosinophils Relative: 2.5 % (ref 0.0–5.0)
HEMATOCRIT: 42.8 % (ref 39.0–52.0)
HEMOGLOBIN: 14.5 g/dL (ref 13.0–17.0)
Lymphocytes Relative: 27.6 % (ref 12.0–46.0)
Lymphs Abs: 2.2 10*3/uL (ref 0.7–4.0)
MCHC: 33.8 g/dL (ref 30.0–36.0)
MCV: 87.7 fl (ref 78.0–100.0)
MONOS PCT: 8.1 % (ref 3.0–12.0)
Monocytes Absolute: 0.7 10*3/uL (ref 0.1–1.0)
NEUTROS ABS: 5 10*3/uL (ref 1.4–7.7)
NEUTROS PCT: 61.6 % (ref 43.0–77.0)
PLATELETS: 289 10*3/uL (ref 150.0–400.0)
RBC: 4.88 Mil/uL (ref 4.22–5.81)
RDW: 12.9 % (ref 11.5–15.5)
WBC: 8.1 10*3/uL (ref 4.0–10.5)

## 2014-07-09 LAB — BASIC METABOLIC PANEL
BUN: 17 mg/dL (ref 6–23)
CALCIUM: 9.1 mg/dL (ref 8.4–10.5)
CHLORIDE: 103 meq/L (ref 96–112)
CO2: 25 mEq/L (ref 19–32)
Creatinine, Ser: 1.01 mg/dL (ref 0.40–1.50)
GFR: 80.26 mL/min (ref >60.00)
Glucose, Bld: 96 mg/dL (ref 70–99)
Potassium: 4.3 mEq/L (ref 3.5–5.1)
SODIUM: 136 meq/L (ref 135–145)

## 2014-07-09 LAB — URIC ACID: Uric Acid, Serum: 6.3 mg/dL (ref 4.0–7.8)

## 2014-07-09 LAB — TSH: TSH: 2.29 u[IU]/mL (ref 0.35–4.50)

## 2014-07-12 LAB — NMR LIPOPROFILE WITH LIPIDS
Cholesterol, Total: 164 mg/dL (ref 100–199)
HDL PARTICLE NUMBER: 29.1 umol/L — AB (ref >=30.5)
HDL Size: 8.5 nm — ABNORMAL LOW (ref >=9.2)
HDL-C: 44 mg/dL (ref >39)
LDL CALC: 88 mg/dL (ref 0–99)
LDL PARTICLE NUMBER: 1406 nmol/L — AB (ref <1000)
LDL SIZE: 20.5 nm (ref >=20.8)
LP-IR SCORE: 84 — AB (ref <=45)
Large HDL-P: <1.3 umol/L — ABNORMAL LOW (ref >=4.8)
Large VLDL-P: 4 nmol/L — ABNORMAL HIGH (ref <=2.7)
SMALL LDL PARTICLE NUMBER: 773 nmol/L — AB (ref <=527)
Triglycerides: 158 mg/dL — ABNORMAL HIGH (ref 0–149)
VLDL SIZE: 57.5 nm — AB (ref <=46.6)

## 2014-08-03 ENCOUNTER — Other Ambulatory Visit: Payer: Self-pay | Admitting: Emergency Medicine

## 2014-08-03 ENCOUNTER — Telehealth: Payer: Self-pay | Admitting: Internal Medicine

## 2014-08-03 MED ORDER — ROSUVASTATIN CALCIUM 20 MG PO TABS: ORAL_TABLET | ORAL | Status: DC

## 2014-08-03 NOTE — Telephone Encounter (Signed)
Patient would like to know if he should be taking Crestor or not. He states his lab results do not specify if he should still be taking it or just following the Mediterranean diet. CB# [phone]

## 2014-08-03 NOTE — Telephone Encounter (Signed)
Sent Crestor to [facility]

## 2014-08-03 NOTE — Telephone Encounter (Signed)
Low dose Crestor 3 X / week is getting him to goal (@ least < 100; ideally < 70) and is protective

## 2014-08-03 NOTE — Telephone Encounter (Signed)
Please advise 

## 2014-09-21 ENCOUNTER — Other Ambulatory Visit: Payer: Self-pay | Admitting: Emergency Medicine

## 2014-09-21 MED ORDER — BENAZEPRIL HCL 40 MG PO TABS: ORAL_TABLET | ORAL | Status: DC

## 2014-12-21 ENCOUNTER — Other Ambulatory Visit: Payer: Self-pay | Admitting: *Deleted

## 2014-12-21 DIAGNOSIS — I10 Essential (primary) hypertension: Secondary | ICD-10-CM

## 2014-12-21 MED ORDER — ROSUVASTATIN CALCIUM 20 MG PO TABS: ORAL_TABLET | ORAL | Status: DC

## 2014-12-21 MED ORDER — BENAZEPRIL HCL 40 MG PO TABS: ORAL_TABLET | ORAL | Status: DC

## 2014-12-21 MED ORDER — TOPROL XL 50 MG PO TB24: ORAL_TABLET | ORAL | Status: DC

## 2014-12-21 NOTE — Telephone Encounter (Signed)
Pt walk-in per [name] pharmacy has been faxing request on medications. Pt is out & needing refills today. Verified med sent Benazepril, Crestor, and metoprolol to [facility]...Bradley Cole

## 2015-03-08 ENCOUNTER — Ambulatory Visit
Admission: RE | Admit: 2015-03-08 | Discharge: 2015-03-08 | Disposition: A | Discharge status: Home / Self Care | Payer: 59 | Source: Ambulatory Visit | Attending: Sports Medicine | Admitting: Sports Medicine

## 2015-03-08 ENCOUNTER — Encounter: Payer: Self-pay | Admitting: Sports Medicine

## 2015-03-08 ENCOUNTER — Ambulatory Visit (INDEPENDENT_AMBULATORY_CARE_PROVIDER_SITE_OTHER): Payer: 59 | Admitting: Sports Medicine

## 2015-03-08 VITALS — BP 165/79 | Ht 75.0 in | Wt 237.0 lb

## 2015-03-08 DIAGNOSIS — M25561 Pain in right knee: Secondary | ICD-10-CM

## 2015-03-08 DIAGNOSIS — M1711 Unilateral primary osteoarthritis, right knee: Secondary | ICD-10-CM | POA: Diagnosis not present

## 2015-03-08 NOTE — Progress Notes (Signed)
   Subjective:    Patient ID: Bradley Cole., male    DOB: [birth year], 60 y.o.   MRN: [id number]  HPI chief complaint: Right knee pain  Very pleasant 60 year old gentleman comes in today complaining of 3 weeks of right knee pain. No injury that he can recall but a gradual onset of pain that is present primarily with activity. In addition to pain he is also getting some mild instability as well as some swelling. He is also getting some stiffness in his knee. His pain is primarily along the medial aspect of the knee. He describes it as a stabbing type of pain. He has tried a couple of different types of compression sleeves but they have not been very helpful. He has continued to exercise despite his pain. Exercise does not seem to make his pain any worse. No numbness or tingling. He had a rather significant knee injury while playing soccer at the age of 2. This resulted in some sort of knee surgery, possibly a medial meniscectomy. He has not had any recent imaging. He has not tried much in the way of over-the-counter pain medication (he does not like taking medication).  Past medical history reviewed Medications reviewed Allergies reviewed    Review of Systems    as above Objective:   Physical Exam Well-developed, well-nourished. No acute distress.  Right knee: Full range of motion. No effusion. He is tender to palpation along the medial joint line but has a negative McMurray's and a negative Thessaly's. Knee is stable to valgus and varus stressing. 1+ anterior drawer compared to a negative anterior drawer in the left knee. Negative posterior drawer. Minimal patellofemoral crepitus. Neurovascularly intact distally.  X-rays of the right knee including AP, lateral, 30 flexion, and sunrise views are reviewed. AP shows moderate medial compartmental narrowing. There is also some mild spurring off of the patella. Nothing acute is seen.       Assessment & Plan:  Right knee pain secondary  to medial compartmental DJD versus degenerative medial meniscal tear  I discussed treatment options with the patient including oral NSAIDs, physical therapy, bracing, cortisone injections, and Visco supplementation. We will start with a home exercise program consisting of quad and hamstring strengthening. 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 Patient will follow-up with me in 3 weeks for reevaluation. Call with questions or concerns in the interim.

## 2015-03-29 ENCOUNTER — Encounter: Payer: Self-pay | Admitting: Sports Medicine

## 2015-03-29 ENCOUNTER — Ambulatory Visit (INDEPENDENT_AMBULATORY_CARE_PROVIDER_SITE_OTHER): Payer: 59 | Admitting: Sports Medicine

## 2015-03-29 VITALS — BP 156/83 | HR 55 | Ht 75.0 in | Wt 234.0 lb

## 2015-03-29 DIAGNOSIS — M25561 Pain in right knee: Secondary | ICD-10-CM | POA: Diagnosis not present

## 2015-03-29 DIAGNOSIS — M1711 Unilateral primary osteoarthritis, right knee: Secondary | ICD-10-CM

## 2015-03-29 NOTE — Progress Notes (Signed)
  Bradley Cole. - 60 y.o. male MRN [id number]  Date of birth: [birth year]  SUBJECTIVE:  Including CC & ROS.  No chief complaint on file. CC: R knee pain  HPI: Patient presents for follow up of his R knee pain. 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 Since last visit, patient has been doing his quad and hamstring strengthening exercises with significant improvement in his pain. Now his pain is localized to the medial aspect of the knee and no longer radiates down his medial tibia. Previously, his pain was constant but now it is only intermittent after more strenuous exercise. Occasionally he will feel a sharp pain in the lateral aspect of his R knee. 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 Was able to walk for an hour without his brace and without pain two days in a row this week. Has not required NSAIDs for pain. Denies swelling, erythema, bruising, weakness, or instability.   HISTORY: Past Medical, Surgical, Social, and Family History Reviewed & Updated per EMR.   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 Is a non-smoker.  DATA REVIEWED: R knee X-rays from 03/08/15 with mild OA of the medial compartment  PHYSICAL EXAM:  VS: BP:(!) 156/83 mmHg  HR:(!) 55bpm  TEMP: ( )  RESP:   HT:6\' 3"  (190.5 cm)   WT:234 lb (106.142 kg)  BMI:29.3 PHYSICAL EXAM: Gen: well-appearing Caucasian male in NAD HEENT: normocephalic, atraumatic, EOMI Pulm: non-labored breathing  R knee: No joint effusion or erythema Mild TTP over the medial joint line. No TTP over lateral joint line, patella, patellar tendon, or quad tendon. Full ROM with flexion and extension - no pain or crepitance 5/5 strength with flexion and extension No increased laxity with varus or valgus stress. Negative anterior and posterior drawer test. Neurovascularly intact  ASSESSMENT & PLAN: See problem based  charting & AVS for pt instructions. 1.) R knee osteoarthritis - Patient with improved symptoms after completing quad and hamstring strengthening exercises. 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 Not requiring NSAID for pain control. Reviewed x-rays with patient. Discussed options for proceeding with treatment - patient wishes to continue exercises, wearing his brace as needed, and using ice/NSAIDs PRN. - Continue hamstring and quad strengthening exercises - Wear brace for additional support as needed - Will follow up PRN - if pain worsens, consider Mobic, steroid injection, or further diagnostic imaging

## 2015-05-03 ENCOUNTER — Telehealth: Payer: Self-pay

## 2015-05-03 NOTE — Telephone Encounter (Signed)
Recd faxed rx refill request from [facility] [address] for toprol xl 50mg  tab----routing to new pcp to handle

## 2015-05-03 NOTE — Telephone Encounter (Signed)
Yes thanks- ok to refill

## 2015-05-04 ENCOUNTER — Other Ambulatory Visit: Payer: Self-pay

## 2015-05-04 DIAGNOSIS — I10 Essential (primary) hypertension: Secondary | ICD-10-CM

## 2015-05-04 MED ORDER — TOPROL XL 50 MG PO TB24: ORAL_TABLET | ORAL | Status: DC

## 2015-07-06 ENCOUNTER — Other Ambulatory Visit (INDEPENDENT_AMBULATORY_CARE_PROVIDER_SITE_OTHER): Payer: 59

## 2015-07-06 DIAGNOSIS — Z0000 Encounter for general adult medical examination without abnormal findings: Secondary | ICD-10-CM | POA: Diagnosis not present

## 2015-07-06 LAB — LIPID PANEL
CHOLESTEROL: 163 mg/dL (ref 0–200)
HDL: 38 mg/dL — ABNORMAL LOW (ref >39.00)
LDL CALC: 92 mg/dL (ref 0–99)
NonHDL: 124.66
Total CHOL/HDL Ratio: 4
Triglycerides: 161 mg/dL — ABNORMAL HIGH (ref 0.0–149.0)
VLDL: 32.2 mg/dL (ref 0.0–40.0)

## 2015-07-06 LAB — CBC WITH DIFFERENTIAL/PLATELET
Basophils Absolute: 0 10*3/uL (ref 0.0–0.1)
Basophils Relative: 0.3 % (ref 0.0–3.0)
EOS PCT: 2.5 % (ref 0.0–5.0)
Eosinophils Absolute: 0.2 10*3/uL (ref 0.0–0.7)
HCT: 40.6 % (ref 39.0–52.0)
Hemoglobin: 13.8 g/dL (ref 13.0–17.0)
LYMPHS ABS: 1.9 10*3/uL (ref 0.7–4.0)
Lymphocytes Relative: 28.2 % (ref 12.0–46.0)
MCHC: 34.1 g/dL (ref 30.0–36.0)
MCV: 87.2 fl (ref 78.0–100.0)
MONO ABS: 0.5 10*3/uL (ref 0.1–1.0)
MONOS PCT: 7.9 % (ref 3.0–12.0)
NEUTROS ABS: 4 10*3/uL (ref 1.4–7.7)
NEUTROS PCT: 61.1 % (ref 43.0–77.0)
PLATELETS: 255 10*3/uL (ref 150.0–400.0)
RBC: 4.66 Mil/uL (ref 4.22–5.81)
RDW: 12.4 % (ref 11.5–15.5)
WBC: 6.6 10*3/uL (ref 4.0–10.5)

## 2015-07-06 LAB — BASIC METABOLIC PANEL
BUN: 15 mg/dL (ref 6–23)
CALCIUM: 9.1 mg/dL (ref 8.4–10.5)
CO2: 29 meq/L (ref 19–32)
Chloride: 103 mEq/L (ref 96–112)
Creatinine, Ser: 0.95 mg/dL (ref 0.40–1.50)
GFR: 85.85 mL/min (ref >60.00)
GLUCOSE: 92 mg/dL (ref 70–99)
POTASSIUM: 4.2 meq/L (ref 3.5–5.1)
SODIUM: 138 meq/L (ref 135–145)

## 2015-07-06 LAB — HEPATIC FUNCTION PANEL
ALBUMIN: 4.1 g/dL (ref 3.5–5.2)
ALT: 25 U/L (ref 0–53)
AST: 19 U/L (ref 0–37)
Alkaline Phosphatase: 70 U/L (ref 39–117)
BILIRUBIN TOTAL: 0.7 mg/dL (ref 0.2–1.2)
Bilirubin, Direct: 0.1 mg/dL (ref 0.0–0.3)
Total Protein: 6.6 g/dL (ref 6.0–8.3)

## 2015-07-06 LAB — POC URINALSYSI DIPSTICK (AUTOMATED)
Bilirubin, UA: NEGATIVE
GLUCOSE UA: NEGATIVE
Ketones, UA: NEGATIVE
Leukocytes, UA: NEGATIVE
NITRITE UA: NEGATIVE
PH UA: 6
PROTEIN UA: NEGATIVE
RBC UA: NEGATIVE
SPEC GRAV UA: 1.025
UROBILINOGEN UA: 0.2

## 2015-07-06 LAB — PSA: PSA: 1.23 ng/mL (ref 0.10–4.00)

## 2015-07-06 LAB — TSH: TSH: 1.42 u[IU]/mL (ref 0.35–4.50)

## 2015-07-08 ENCOUNTER — Encounter: Payer: Self-pay | Admitting: Family Medicine

## 2015-07-08 ENCOUNTER — Ambulatory Visit (INDEPENDENT_AMBULATORY_CARE_PROVIDER_SITE_OTHER): Payer: 59 | Admitting: Family Medicine

## 2015-07-08 VITALS — BP 142/90 | HR 73 | Temp 97.9°F | Ht 75.0 in | Wt 245.0 lb

## 2015-07-08 DIAGNOSIS — Z0001 Encounter for general adult medical examination with abnormal findings: Secondary | ICD-10-CM | POA: Diagnosis not present

## 2015-07-08 DIAGNOSIS — I341 Nonrheumatic mitral (valve) prolapse: Secondary | ICD-10-CM | POA: Insufficient documentation

## 2015-07-08 DIAGNOSIS — E782 Mixed hyperlipidemia: Secondary | ICD-10-CM

## 2015-07-08 DIAGNOSIS — F17200 Nicotine dependence, unspecified, uncomplicated: Secondary | ICD-10-CM | POA: Diagnosis not present

## 2015-07-08 DIAGNOSIS — I10 Essential (primary) hypertension: Secondary | ICD-10-CM

## 2015-07-08 DIAGNOSIS — Z1211 Encounter for screening for malignant neoplasm of colon: Secondary | ICD-10-CM

## 2015-07-08 DIAGNOSIS — Z23 Encounter for immunization: Secondary | ICD-10-CM

## 2015-07-08 MED ORDER — AMLODIPINE BESYLATE 2.5 MG PO TABS: 2.5000 mg | ORAL_TABLET | Freq: Every day | ORAL | Status: DC

## 2015-07-08 NOTE — Patient Instructions (Addendum)
Continue current blood pressure medicine but add amlodipine 2.5mg . Let's check in a 6 weeks from now with weight loss 6 lb goal.    Tdap today  Advise to give up cigars  We will call you within a week about your referral to GI for colonoscopy. If you do not hear within 2 weeks, give [place] a call.   Willing to work wife in [place] can use any slot 8:15, 8:45, 1:15 or 1:45 any day of the week  DASH Eating Plan DASH stands for "Dietary Approaches to Stop Hypertension." The DASH eating plan is a healthy eating plan that has been shown to reduce high blood pressure (hypertension). Additional health benefits may include reducing the risk of type 2 diabetes mellitus, heart disease, and stroke. The DASH eating plan may also help with weight loss. WHAT DO I NEED TO KNOW ABOUT THE DASH EATING PLAN? For the DASH eating plan, you will follow these general guidelines:  Choose foods with a percent daily value for sodium of less than 5% (as listed on the food label).  Use salt-free seasonings or herbs instead of table salt or sea salt.  Check with your health care provider or pharmacist before using salt substitutes.  Eat lower-sodium products, often labeled as "lower sodium" or "no salt added."  Eat fresh foods.  Eat more vegetables, fruits, and low-fat dairy products.  Choose whole grains. Look for the word "whole" as the first word in the ingredient list.  Choose fish and skinless chicken or [place] more often than red meat. Limit fish, poultry, and meat to 6 oz (170 g) each day.  Limit sweets, desserts, sugars, and sugary drinks.  Choose heart-healthy fats.  Limit cheese to 1 oz (28 g) per day.  Eat more home-cooked food and less restaurant, buffet, and fast food.  Limit fried foods.  Cook foods using methods other than frying.  Limit canned vegetables. If you do use them, rinse them well to decrease the sodium.  When eating at a restaurant, ask that your food be prepared with less salt,  or no salt if possible. WHAT FOODS CAN I EAT? Seek help from a dietitian for individual calorie needs. Grains Whole grain or whole wheat bread. Brown rice. Whole grain or whole wheat pasta. Quinoa, bulgur, and whole grain cereals. Low-sodium cereals. Corn or whole wheat flour tortillas. Whole grain cornbread. Whole grain crackers. Low-sodium crackers. Vegetables Fresh or frozen vegetables (raw, steamed, roasted, or grilled). Low-sodium or reduced-sodium tomato and vegetable juices. Low-sodium or reduced-sodium tomato sauce and paste. Low-sodium or reduced-sodium canned vegetables.  Fruits All fresh, canned (in natural juice), or frozen fruits. Meat and Other Protein Products Ground beef (85% or leaner), grass-fed beef, or beef trimmed of fat. Skinless chicken or [place]. Ground chicken or [place]. Pork trimmed of fat. All fish and seafood. Eggs. Dried beans, peas, or lentils. Unsalted nuts and seeds. Unsalted canned beans. Dairy Low-fat dairy products, such as skim or 1% milk, 2% or reduced-fat cheeses, low-fat ricotta or cottage cheese, or plain low-fat yogurt. Low-sodium or reduced-sodium cheeses. Fats and Oils Tub margarines without trans fats. Light or reduced-fat mayonnaise and salad dressings (reduced sodium). Avocado. Safflower, olive, or canola oils. Natural peanut or almond butter. Other Unsalted popcorn and pretzels. The items listed above may not be a complete list of recommended foods or beverages. Contact your dietitian for more options. WHAT FOODS ARE NOT RECOMMENDED? Grains White bread. White pasta. White rice. Refined cornbread. Bagels and croissants. Crackers that contain trans fat.  Vegetables Creamed or fried vegetables. Vegetables in a cheese sauce. Regular canned vegetables. Regular canned tomato sauce and paste. Regular tomato and vegetable juices. Fruits Dried fruits. Canned fruit in light or heavy syrup. Fruit juice. Meat and Other Protein Products Fatty cuts of meat.  Ribs, chicken wings, bacon, sausage, bologna, salami, chitterlings, fatback, hot dogs, bratwurst, and packaged luncheon meats. Salted nuts and seeds. Canned beans with salt. Dairy Whole or 2% milk, cream, half-and-half, and cream cheese. Whole-fat or sweetened yogurt. Full-fat cheeses or blue cheese. Nondairy creamers and whipped toppings. Processed cheese, cheese spreads, or cheese curds. Condiments Onion and garlic salt, seasoned salt, table salt, and sea salt. Canned and packaged gravies. Worcestershire sauce. Tartar sauce. Barbecue sauce. Teriyaki sauce. Soy sauce, including reduced sodium. Steak sauce. Fish sauce. Oyster sauce. Cocktail sauce. Horseradish. Ketchup and mustard. Meat flavorings and tenderizers. Bouillon cubes. Hot sauce. Tabasco sauce. Marinades. Taco seasonings. Relishes. Fats and Oils Butter, stick margarine, lard, shortening, ghee, and bacon fat. Coconut, palm kernel, or palm oils. Regular salad dressings. Other Pickles and olives. Salted popcorn and pretzels. The items listed above may not be a complete list of foods and beverages to avoid. Contact your dietitian for more information. WHERE CAN I FIND MORE INFORMATION? National Heart, Lung, and Blood Institute: travelstabloid.com   This information is not intended to replace advice given to you by your health care provider. Make sure you discuss any questions you have with your health care provider.   Document Released: 01/11/2011 Document Revised: 02/12/2014 Document Reviewed: 11/26/2012 Elsevier Interactive Patient Education [employer].

## 2015-07-08 NOTE — Progress Notes (Signed)
Phone: [phone]  Subjective:  Patient presents today for their annual physical. Chief complaint-noted.   See problem oriented charting- ROS- full  review of systems was completed and negative including No chest pain or shortness of breath. No headache or blurry vision.   The following were reviewed and entered/updated in epic: Past Medical History  Diagnosis Date  . DDD (degenerative disc disease)   . Hyperlipidemia     LDL goal < 100, ideally < 70  . Hypertension   . Degenerative disc disease, lumbar    Patient Active Problem List   Diagnosis Date Noted  . Essential hypertension 12/30/2007    Priority: Medium  . HYPERLIPIDEMIA 11/19/2006    Priority: Medium  . MVP (mitral valve prolapse) 07/08/2015    Priority: Low  . DDD (degenerative disc disease)     Priority: Low  . Hx of gout 12/30/2007    Priority: Low   Past Surgical History  Procedure Laterality Date  . Knee surgery      R knee torn ligament  . Colonoscopy  2007    negative, [facility]    Family History  Problem Relation Age of Onset  . Hypertension Father   . Mitral valve prolapse Father   . Lymphoma Father     mantel cell  . Glaucoma Father   . Macular degeneration Father   . Benign prostatic hyperplasia Father   . Diabetes Father   . Hypertension Mother   . Heart disease Mother     valvular heart disease  . Heart attack Mother     based on findings @ valve replacement  . Migraines Mother   . Heart attack Maternal Grandfather     in 34s  . Heart disease Maternal Grandfather   . Diabetes Paternal Grandmother   . Hypertension Paternal Grandmother   . Stroke Neg Hx   . Colon cancer Neg Hx   . Alcohol abuse Son   . Heart disease Maternal Grandmother     Medications- reviewed and updated Current Outpatient Prescriptions  Medication Sig Dispense Refill  . benazepril (LOTENSIN) 40 MG tablet TAKE 1 TABLET BY  MOUTH ONCE DAILY. 90 tablet 2  . Multiple Vitamins-Minerals (MEGA MULTI MEN PO)  Take by mouth 2 (two) times daily.      . rosuvastatin (CRESTOR) 20 MG tablet take 1 tablet by mouth ON MONDAY WEDNESDAY AND FRIDAY 36 tablet 11  . TOPROL XL 50 MG 24 hr tablet take 1/2 to 1 tablet by mouth once daily TO CONTROL BLOOD PRESSURE 130/85 ON AVERAGE (Patient taking differently: take 1 tablet by mouth once daily TO CONTROL BLOOD PRESSURE 130/85 ON AVERAGE) 45 tablet 2   Allergies-reviewed and updated Allergies  Allergen Reactions  . Penicillins     Rash @ 14; he has had Ampicillin since w/o adverse  effect    Social History   Social History  . Marital Status: Married    Spouse Name: N/A  . Number of Children: N/A  . Years of Education: N/A   Social History Main Topics  . Smoking status: Current Some Day Smoker    Types: Cigars    Last Attempt to Quit: 11/07/2010  . Smokeless tobacco: Former Systems developer    Types: Chew     Comment: 4 cigars per month during hunting season; he has stopped chewing tobacco  . Alcohol Use: 1.8 oz/week    3 Glasses of wine per week     Comment: socially   . Drug Use: No  .  Sexual Activity: Not Asked   Other Topics Concern  . None   Social History Narrative   Married 37 years in 2017. 2 children 13 and 56. 4 grandkids (1 on way included) in 2017.       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 Oropharynx normal Neck: no thyromegaly CV: RRR no murmurs rubs or gallops Lungs: CTAB no crackles, wheeze, rhonchi Abdomen: soft/nontender/nondistended/normal bowel sounds. No rebound or guarding.  Ext: no edema, 2+ PT pulses Skin: warm, dry Neuro: grossly normal, moves all extremities, PERRLA Rectal: normal tone, normal size prostate, no masses or  tenderness  Assessment/Plan:  60 y.o. male presenting for annual physical.  Health Maintenance counseling: 1. Anticipatory guidance: Patient counseled regarding regular dental exams, eye exams, wearing seatbelts.  2. Risk factor reduction:  Advised patient of need for regular exercise and diet rich and fruits and vegetables to reduce risk of heart attack and stroke. Goes to gym 3x a week and exercises total 4 days a week- 2 days CV, 2 days cardio. Owns farm in [place] and active. Diet reasonable but maybe needs to cut portion size.  3. Immunizations/screenings/ancillary studies Immunization History  Administered Date(s) Administered  . Tdap- today 03/03/2012   Health Maintenance Due  Topic Date Due  . Hepatitis C Screening - labs next year October 12, 1955  . HIV Screening - labs next year 02/24/1970  . COLONOSCOPY - refer today 02/06/2015  . ZOSTAVAX - declined 02/25/2015   4. Prostate cancer screening- low risk based off PSA and rectal exam   Lab Results  Component Value Date   PSA 1.23 07/06/2015   PSA 1.06 06/29/2010   PSA 1.10 06/30/2009   5. Colon cancer screening - referred today- last at age 42  Status of chronic or acute concerns   HYPERLIPIDEMIA S: well controlled on crestor 20mg  MWF. No myalgias.  Lab Results  Component Value Date   CHOL 163 07/06/2015   HDL 38.00* 07/06/2015   LDLCALC 92 07/06/2015   TRIG 161.0* 07/06/2015   CHOLHDL 4 07/06/2015   A/P: continue current rx  Essential hypertension S: controlled on benazepril 40mg  and toprol 50mg  XL BP Readings from Last 3 Encounters:  07/08/15 142/90  03/29/15 156/83  03/08/15 165/79  A/P:Continue current meds:  But given  3 consecutive elevated readings in smoker- add amlodipine 2.5 mg but also work on [employer]. With weight loss may be able to stop the amlodipine.4-6 week follow up  Tobacco use disorder S: Cigar smoker 2x a month A/P: I advised patient complete cessation and he is planning to quit   4-6 week  BP check. Weight goal 4-6 lbs down. Also told patient willing to see his wife- flexibility on scheduling Return precautions advised.   Orders Placed This Encounter  Procedures  . Tdap vaccine greater than or equal to 7yo IM  . Ambulatory referral to Gastroenterology    Referral Priority:  Routine    Referral Type:  Consultation    Referral Reason:  Specialty Services Required    Number of Visits Requested:  1    Meds ordered this encounter  Medications  . amLODipine (NORVASC)  2.5 MG tablet    Sig: Take 1 tablet (2.5 mg total) by mouth daily.    Dispense:  30 tablet    Refill:  3  Established issue, poor control- poorly controlled BP requiring additional medication support  Garret Reddish, MD

## 2015-07-09 DIAGNOSIS — F17200 Nicotine dependence, unspecified, uncomplicated: Secondary | ICD-10-CM | POA: Insufficient documentation

## 2015-07-09 NOTE — Assessment & Plan Note (Signed)
S: Cigar smoker 2x a month A/P: I advised patient complete cessation and he is planning to quit

## 2015-07-09 NOTE — Assessment & Plan Note (Signed)
S: well controlled on crestor 20mg  MWF. No myalgias.  Lab Results  Component Value Date   CHOL 163 07/06/2015   HDL 38.00* 07/06/2015   LDLCALC 92 07/06/2015   TRIG 161.0* 07/06/2015   CHOLHDL 4 07/06/2015   A/P: continue current rx

## 2015-07-09 NOTE — Assessment & Plan Note (Signed)
S: controlled on benazepril 40mg  and toprol 50mg  XL BP Readings from Last 3 Encounters:  07/08/15 142/90  03/29/15 156/83  03/08/15 165/79  A/P:Continue current meds:  But given  3 consecutive elevated readings in smoker- add amlodipine 2.5 mg but also work on [employer]. With weight loss may be able to stop the amlodipine.4-6 week follow up

## 2015-08-11 ENCOUNTER — Other Ambulatory Visit: Payer: Self-pay

## 2015-08-11 ENCOUNTER — Telehealth: Payer: Self-pay | Admitting: Family Medicine

## 2015-08-11 DIAGNOSIS — I10 Essential (primary) hypertension: Secondary | ICD-10-CM

## 2015-08-11 MED ORDER — TOPROL XL 50 MG PO TB24: ORAL_TABLET | ORAL | Status: DC

## 2015-08-11 NOTE — Telephone Encounter (Signed)
Pharmacy calling pt out of medication pt need Toprol XL 50 mg    Pharm:  [facility]

## 2015-08-11 NOTE — Telephone Encounter (Signed)
Prescription refill sent. Patient called and notified.

## 2015-08-19 ENCOUNTER — Encounter: Payer: Self-pay | Admitting: Family Medicine

## 2015-08-19 ENCOUNTER — Ambulatory Visit (INDEPENDENT_AMBULATORY_CARE_PROVIDER_SITE_OTHER): Payer: 59 | Admitting: Family Medicine

## 2015-08-19 VITALS — BP 130/78 | HR 66 | Temp 97.2°F | Ht 75.0 in | Wt 243.2 lb

## 2015-08-19 DIAGNOSIS — E782 Mixed hyperlipidemia: Secondary | ICD-10-CM

## 2015-08-19 DIAGNOSIS — I10 Essential (primary) hypertension: Secondary | ICD-10-CM

## 2015-08-19 NOTE — Assessment & Plan Note (Signed)
S: controlled. On benazepril 82m and toprol 571mXL. With addition of amlodipine 2.39m69m30/78. Biggest thing for him has been adding extra day of exercise now 4 days a week. Doing light beer instead of cocktail.  BP Readings from Last 3 Encounters:  08/19/15 130/78  07/08/15 142/90  03/29/15 156/83  A/P:Continue current meds:  Much improved control. Patient states goal weight in 220s- if he achieves will schedule follow up to see if can come off some BP meds- likely amlodipine. He will see me in 6 months regardless if he has nit met goal. Weight down 2 lbs from last visit- continue lifestyle modifications.

## 2015-08-19 NOTE — Patient Instructions (Signed)
Goal of 220 or 6 months whichever comes first- call me- we will recheck blood pressure and may be able to reduce in future- already seeing big improvements.   Continue to monitor at least once a week with goal <140/90. If you start getting lightheaded or if pressures in 100s/60s regularly- let me know as may be able to reduce dose

## 2015-08-19 NOTE — Progress Notes (Signed)
Subjective:  Bradley Cole. is a 60 y.o. year old very pleasant male patient who presents for/with See problem oriented charting ROS- No chest pain or shortness of breath. No headache or blurry vision. .see any ROS included in HPI as well.   Past Medical History-  Patient Active Problem List   Diagnosis Date Noted  . Essential hypertension 12/30/2007    Priority: Medium  . HYPERLIPIDEMIA 11/19/2006    Priority: Medium  . Tobacco use disorder 07/09/2015    Priority: Low  . MVP (mitral valve prolapse) 07/08/2015    Priority: Low  . DDD (degenerative disc disease)     Priority: Low  . Hx of gout 12/30/2007    Priority: Low    Medications- reviewed and updated Current Outpatient Prescriptions  Medication Sig Dispense Refill  . amLODipine (NORVASC) 2.5 MG tablet Take 1 tablet (2.5 mg total) by mouth daily. 30 tablet 3  . benazepril (LOTENSIN) 40 MG tablet TAKE 1 TABLET BY  MOUTH ONCE DAILY. 90 tablet 2  . Multiple Vitamins-Minerals (MEGA MULTI MEN PO) Take by mouth daily.     . rosuvastatin (CRESTOR) 20 MG tablet take 1 tablet by mouth ON MONDAY WEDNESDAY AND FRIDAY 36 tablet 11  . TOPROL XL 50 MG 24 hr tablet take 1/2 to 1 tablet by mouth once daily TO CONTROL BLOOD PRESSURE 130/85 ON AVERAGE 45 tablet 4   No current facility-administered medications for this visit.    Objective: BP 130/78 mmHg  Pulse 66  Temp(Src) 97.2 F (36.2 C) (Oral)  Ht '6\' 3"'  (1.905 m)  Wt 243 lb 3.2 oz (110.315 kg)  BMI 30.40 kg/m2  SpO2 98% Gen: NAD, resting comfortably CV: RRR no murmurs rubs or gallops Lungs: CTAB no crackles, wheeze, rhonchi Abdomen: soft/nontender/nondistended/normal bowel sounds. obese Ext: no edema Skin: warm, dry Neuro: grossly normal, moves all extremities  Assessment/Plan:  Essential hypertension S: controlled. On benazepril 30m and toprol 554mXL. With addition of amlodipine 2.108m67m30/78. Biggest thing for him has been adding extra day of exercise now 4 days  a week. Doing light beer instead of cocktail.  BP Readings from Last 3 Encounters:  08/19/15 130/78  07/08/15 142/90  03/29/15 156/83  A/P:Continue current meds:  Much improved control. Patient states goal weight in 220s- if he achieves will schedule follow up to see if can come off some BP meds- likely amlodipine. He will see me in 6 months regardless if he has nit met goal. Weight down 2 lbs from last visit- continue lifestyle modifications.   Tobacco use disorder Cigar smoker 2x a month. agrees to quit 07/2015. As of 08/19/15 has quit completely  Hyperlipidemia S: well controlled on MWF crestor. No myalgias.  Lab Results  Component Value Date   CHOL 163 07/06/2015   HDL 38.00* 07/06/2015   LDLCALC 92 07/06/2015   TRIG 161.0* 07/06/2015   CHOLHDL 4 07/06/2015   A/P: continue current medication with LDL goal <100   Return precautions advised.  SteGarret ReddishD

## 2015-08-19 NOTE — Progress Notes (Signed)
Pre visit review using our clinic review tool, if applicable. No additional management support is needed unless otherwise documented below in the visit note. 

## 2015-08-19 NOTE — Assessment & Plan Note (Signed)
Cigar smoker 2x a month. agrees to quit 07/2015. As of 08/19/15 has quit completely

## 2015-10-31 ENCOUNTER — Other Ambulatory Visit: Payer: Self-pay | Admitting: Family Medicine

## 2016-01-13 ENCOUNTER — Other Ambulatory Visit: Payer: Self-pay | Admitting: Internal Medicine

## 2016-01-16 ENCOUNTER — Other Ambulatory Visit: Payer: Self-pay | Admitting: Family Medicine

## 2016-01-17 ENCOUNTER — Other Ambulatory Visit: Payer: Self-pay | Admitting: Internal Medicine

## 2016-02-20 ENCOUNTER — Ambulatory Visit: Payer: BLUE CROSS/BLUE SHIELD | Admitting: Family Medicine

## 2016-02-21 ENCOUNTER — Other Ambulatory Visit: Payer: Self-pay

## 2016-02-21 DIAGNOSIS — I10 Essential (primary) hypertension: Secondary | ICD-10-CM

## 2016-02-21 MED ORDER — TOPROL XL 50 MG PO TB24: ORAL_TABLET | ORAL | 4 refills | Status: DC

## 2016-05-01 ENCOUNTER — Other Ambulatory Visit: Payer: Self-pay | Admitting: Family Medicine

## 2016-05-03 ENCOUNTER — Encounter: Payer: Self-pay | Admitting: Gastroenterology

## 2016-05-07 ENCOUNTER — Other Ambulatory Visit: Payer: Self-pay | Admitting: Family Medicine

## 2016-07-14 ENCOUNTER — Other Ambulatory Visit: Payer: Self-pay | Admitting: Internal Medicine

## 2016-07-16 ENCOUNTER — Other Ambulatory Visit: Payer: Self-pay | Admitting: Family Medicine

## 2016-07-16 ENCOUNTER — Other Ambulatory Visit: Payer: Self-pay | Admitting: Internal Medicine

## 2016-07-30 ENCOUNTER — Ambulatory Visit (AMBULATORY_SURGERY_CENTER): Payer: Self-pay

## 2016-07-30 VITALS — Ht 75.6 in | Wt 247.0 lb

## 2016-07-30 DIAGNOSIS — Z1211 Encounter for screening for malignant neoplasm of colon: Secondary | ICD-10-CM

## 2016-07-30 MED ORDER — NA SULFATE-K SULFATE-MG SULF 17.5-3.13-1.6 GM/177ML PO SOLN: 1.0000 | Freq: Once | ORAL | 0 refills | Status: AC

## 2016-07-30 NOTE — Progress Notes (Signed)
Denies allergies to eggs or soy products. Denies complication of anesthesia or sedation. Denies use of weight loss medication. Denies use of O2.   Emmi instructions declined. Patient not interested.

## 2016-08-13 ENCOUNTER — Encounter: Payer: 59 | Admitting: Gastroenterology

## 2016-09-11 ENCOUNTER — Other Ambulatory Visit: Payer: Self-pay | Admitting: Family Medicine

## 2016-09-11 DIAGNOSIS — I10 Essential (primary) hypertension: Secondary | ICD-10-CM

## 2016-10-31 ENCOUNTER — Other Ambulatory Visit: Payer: Self-pay | Admitting: Family Medicine

## 2016-10-31 DIAGNOSIS — I10 Essential (primary) hypertension: Secondary | ICD-10-CM

## 2016-11-02 ENCOUNTER — Other Ambulatory Visit: Payer: Self-pay

## 2016-11-02 ENCOUNTER — Telehealth: Payer: Self-pay | Admitting: Family Medicine

## 2016-11-02 DIAGNOSIS — I10 Essential (primary) hypertension: Secondary | ICD-10-CM

## 2016-11-02 MED ORDER — TOPROL XL 50 MG PO TB24: ORAL_TABLET | ORAL | 5 refills | Status: DC

## 2016-11-02 NOTE — Telephone Encounter (Signed)
Pt has been out of bp med for 4 days

## 2016-11-02 NOTE — Telephone Encounter (Signed)
Pt is calling needing new Rx TOPROL XL 50   Pharm:  [facility] [address].   Pt is out of this medication.

## 2016-11-02 NOTE — Telephone Encounter (Signed)
Prescription sent to pharmacy as requested.

## 2016-11-13 ENCOUNTER — Other Ambulatory Visit: Payer: Self-pay | Admitting: Family Medicine

## 2016-11-19 ENCOUNTER — Ambulatory Visit (INDEPENDENT_AMBULATORY_CARE_PROVIDER_SITE_OTHER): Payer: 59 | Admitting: Family Medicine

## 2016-11-19 ENCOUNTER — Encounter: Payer: Self-pay | Admitting: Family Medicine

## 2016-11-19 VITALS — BP 122/82 | HR 61 | Temp 97.7°F | Ht 75.0 in | Wt 242.4 lb

## 2016-11-19 DIAGNOSIS — I10 Essential (primary) hypertension: Secondary | ICD-10-CM

## 2016-11-19 DIAGNOSIS — Z0000 Encounter for general adult medical examination without abnormal findings: Secondary | ICD-10-CM

## 2016-11-19 DIAGNOSIS — Z1283 Encounter for screening for malignant neoplasm of skin: Secondary | ICD-10-CM

## 2016-11-19 DIAGNOSIS — E782 Mixed hyperlipidemia: Secondary | ICD-10-CM

## 2016-11-19 DIAGNOSIS — Z125 Encounter for screening for malignant neoplasm of prostate: Secondary | ICD-10-CM

## 2016-11-19 LAB — CBC
HCT: 41 % (ref 39.0–52.0)
Hemoglobin: 14.1 g/dL (ref 13.0–17.0)
MCHC: 34.4 g/dL (ref 30.0–36.0)
MCV: 88.6 fl (ref 78.0–100.0)
Platelets: 284 10*3/uL (ref 150.0–400.0)
RBC: 4.63 Mil/uL (ref 4.22–5.81)
RDW: 13.3 % (ref 11.5–15.5)
WBC: 7.1 10*3/uL (ref 4.0–10.5)

## 2016-11-19 LAB — POC URINALSYSI DIPSTICK (AUTOMATED)
Bilirubin, UA: NEGATIVE
GLUCOSE UA: NEGATIVE
Ketones, UA: NEGATIVE
LEUKOCYTES UA: NEGATIVE
NITRITE UA: NEGATIVE
Protein, UA: NEGATIVE
RBC UA: NEGATIVE
Spec Grav, UA: 1.025 (ref 1.010–1.025)
UROBILINOGEN UA: 0.2 U/dL
pH, UA: 6 (ref 5.0–8.0)

## 2016-11-19 LAB — COMPREHENSIVE METABOLIC PANEL
ALBUMIN: 4.5 g/dL (ref 3.5–5.2)
ALK PHOS: 79 U/L (ref 39–117)
ALT: 25 U/L (ref 0–53)
AST: 24 U/L (ref 0–37)
BUN: 15 mg/dL (ref 6–23)
CO2: 29 mEq/L (ref 19–32)
CREATININE: 0.94 mg/dL (ref 0.40–1.50)
Calcium: 9.2 mg/dL (ref 8.4–10.5)
Chloride: 101 mEq/L (ref 96–112)
GFR: 86.51 mL/min (ref >60.00)
GLUCOSE: 82 mg/dL (ref 70–99)
Potassium: 4.1 mEq/L (ref 3.5–5.1)
SODIUM: 138 meq/L (ref 135–145)
TOTAL PROTEIN: 7.3 g/dL (ref 6.0–8.3)
Total Bilirubin: 0.9 mg/dL (ref 0.2–1.2)

## 2016-11-19 LAB — LIPID PANEL
CHOLESTEROL: 152 mg/dL (ref 0–200)
HDL: 44.4 mg/dL (ref >39.00)
LDL Cholesterol: 78 mg/dL (ref 0–99)
NONHDL: 107.64
TRIGLYCERIDES: 147 mg/dL (ref 0.0–149.0)
Total CHOL/HDL Ratio: 3
VLDL: 29.4 mg/dL (ref 0.0–40.0)

## 2016-11-19 LAB — PSA: PSA: 1.23 ng/mL (ref 0.10–4.00)

## 2016-11-19 NOTE — Patient Instructions (Signed)
Please stop by lab before you go  We will call you within a week or two about your referral to dermatology Dr. Ubaldo Glassing. If you do not hear within 3 weeks, give [place] a call.

## 2016-11-19 NOTE — Progress Notes (Signed)
Phone: [phone]  Subjective:  Patient presents today for their annual physical. Chief complaint-noted.   See problem oriented charting- ROS- full  review of systems was completed and negative including No chest pain or shortness of breath. No headache or blurry vision.   The following were reviewed and entered/updated in epic: Past Medical History:  Diagnosis Date  . DDD (degenerative disc disease)   . Degenerative disc disease, lumbar   . Degenerative disc disease, lumbar   . Heart murmur   . Hyperlipidemia    LDL goal < 100, ideally < 70  . Hypertension    Patient Active Problem List   Diagnosis Date Noted  . Essential hypertension 12/30/2007    Priority: Medium  . HYPERLIPIDEMIA 11/19/2006    Priority: Medium  . Tobacco use disorder 07/09/2015    Priority: Low  . MVP (mitral valve prolapse) 07/08/2015    Priority: Low  . DDD (degenerative disc disease)     Priority: Low  . Hx of gout 12/30/2007    Priority: Low   Past Surgical History:  Procedure Laterality Date  . COLONOSCOPY  2007   negative, [facility]  . KNEE SURGERY     R knee torn ligament  . POLYPECTOMY      Family History  Problem Relation Age of Onset  . Hypertension Father   . Mitral valve prolapse Father   . Lymphoma Father        mantel cell  . Glaucoma Father   . Macular degeneration Father   . Benign prostatic hyperplasia Father   . Diabetes Father   . Hypertension Mother   . Heart disease Mother        valvular heart disease  . Heart attack Mother        based on findings @ valve replacement  . Migraines Mother   . Heart attack Maternal Grandfather        in 5s  . Heart disease Maternal Grandfather   . Diabetes Paternal Grandmother   . Hypertension Paternal Grandmother   . Alcohol abuse Son   . Heart disease Maternal Grandmother   . Stroke Neg Hx   . Colon cancer Neg Hx   . Stomach cancer Neg Hx   . Rectal cancer Neg Hx     Medications- reviewed and updated Current  Outpatient Prescriptions  Medication Sig Dispense Refill  . amLODipine (NORVASC) 2.5 MG tablet take 1 tablet by mouth once daily 30 tablet 5  . benazepril (LOTENSIN) 40 MG tablet take 1 tablet by mouth once daily 90 tablet 1  . Multiple Vitamins-Minerals (MEGA MULTI MEN PO) Take by mouth daily.     . rosuvastatin (CRESTOR) 20 MG tablet take 1 tablet by mouth MONDAY WEDNESDAY AND FRIDAY 36 tablet 3  . TOPROL XL 50 MG 24 hr tablet TAKE 1/2 TO 1 TABLET BY MOUTH ONCE DAILY TO CONTROL BLOOD PRESSURE 130/85 ON AVERAGE 45 tablet 5   No current facility-administered medications for this visit.     Allergies-reviewed and updated Allergies  Allergen Reactions  . Penicillins     Rash @ 14; he has had Ampicillin since w/o adverse  effect    Social History   Social History  . Marital status: Married    Spouse name: N/A  . Number of children: N/A  . Years of education: N/A   Social History Main Topics  . Smoking status: Former Smoker    Types: Cigars    Quit date: 11/07/2010  . Smokeless tobacco:  Former Systems developer    Types: Chew     Comment: quite smoking a year ago.  . Alcohol use 1.8 oz/week    3 Glasses of wine per week     Comment: socially   . Drug use: No  . Sexual activity: Not Asked   Other Topics Concern  . None   Social History Narrative   Married 37 years in 2017. 2 children 32 and 3. 4 grandkids (1 on way included) in 2017.       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 Oropharynx normal Neck: no thyromegaly CV: RRR no murmurs rubs or  gallops Lungs: CTAB no crackles, wheeze, rhonchi Abdomen: soft/nontender/nondistended/normal bowel sounds. No rebound or guarding.  Ext: no edema Skin: warm, dry, several actinic keratosis on sun damaged skin Neuro: grossly normal, moves all extremities, PERRLA Rectal: normal tone, normal sized prostate, no masses or tenderness  Assessment/Plan:  61 y.o. male presenting for annual physical.  Health Maintenance counseling: 1. Anticipatory guidance: Patient counseled regarding regular dental exams -q6 months, eye exams yearly, wearing seatbelts.  2. Risk factor reduction:  Advised patient of need for regular exercise and diet rich and fruits and vegetables to reduce risk of heart attack and stroke. Exercise- added once a week yoga, walking an hour every morning 3x a week and gym 3x a week. Diet-still struggling with weight loss though is down 1 lb- does think he could cut alcohol down and lose weight. Did drop down to 233 earlier this year with cutting breads, pastas, potatoes.  Wt Readings from Last 3 Encounters:  11/19/16 242 lb 6.4 oz (110 kg)  07/30/16 247 lb (112 kg)  08/19/15 243 lb 3.2 oz (110.3 kg)  3. Immunizations/screenings/ancillary studies- declines flu shot. Declines screen hiv, hcv Immunization History  Administered Date(s) Administered  . Tdap 03/03/2012, 07/08/2015  4. Prostate cancer screening-  update PSA trend. Low risk rectal exam Lab Results  Component Value Date   PSA 1.23 07/06/2015   PSA 1.06 06/29/2010   PSA 1.10 06/30/2009   5. Colon cancer screening - last at age 25, no precancerous polyps. Cannot tolerate 2 day prep but per notes from 10 years ago- poor prep. We will see if there is 1 day option still or otherwise do cologuard 6. Skin cancer screening- advised regular sunscreen use. Denies worrisome, changing, or new skin lesions.  Sun damaged skin- several AKs- we do not have cryotherapy available at this office yet- will refer back to Dr. Ubaldo Glassing who he saw  years ago  Status of chronic or acute concerns   HTN- controlled on amlodipine 2.5mg  and benazepril 40mg  and toprol XL 50mg    HLD- LDL reasonable on last check on rosuvastatin 20mg  MWF- update today. Wants to reduce if able. Would do that only if LDL <70 likely.   Hx gout- no episodes. uses organic cherry juice only with flare ups and resolves   Once again- I do not hear prior MVP prolapse  that Dr. Linna Darner had heard  Remains off cigars and quit chewing!! Congratulated him  1 year CPE  Orders Placed This Encounter  Procedures  . CBC    [place]  . Comprehensive metabolic panel    [place]    Order Specific Question:   Has the patient fasted?    Answer:   No  . Lipid panel    [place]    Order Specific Question:   Has the patient fasted?    Answer:   No  . PSA  . Ambulatory referral to Dermatology    Referral Priority:   Routine    Referral Type:   Consultation    Referral Reason:   Specialty Services Required    Referred to Provider:   Rolm Bookbinder, MD    Requested Specialty:   Dermatology    Number of Visits Requested:   1  . POCT Urinalysis Dipstick (Automated)   Return precautions advised.  Garret Reddish, MD

## 2016-11-21 ENCOUNTER — Telehealth: Payer: Self-pay

## 2016-11-21 NOTE — Telephone Encounter (Signed)
No recent DPR. Left message to return call.

## 2016-11-21 NOTE — Telephone Encounter (Signed)
-----   Message from Mauri Pole, MD sent at 11/20/2016  5:00 PM EDT ----- Given life style changes patient has made, may be we can try to do with 1 day prep.  Beth can you please schedule patient for pre op visit Thanks VN ----- Message ----- From: Marin Olp, MD Sent: 11/19/2016  11:24 AM To: Mauri Pole, MD  Hey Dr. Silverio Decamp,   Bradley Cole was going to do his preparation last year for colonoscopy but states simply cannot tolerate 2 day prep- anyway there is a prep he can do over 1 day? Otherwise we will get him set up for cologuard. I obviously prefer colonoscopy so hoping we can make this work.   Thanks, Garret Reddish

## 2016-11-26 ENCOUNTER — Encounter: Payer: Self-pay | Admitting: Gastroenterology

## 2016-11-26 NOTE — Telephone Encounter (Signed)
Spoke to Mr Handshoe and Colonoscopy was scheduled for Friday 01-18-17.

## 2016-11-26 NOTE — Telephone Encounter (Signed)
Patient needs a screening colonoscopy with normal prep. Would someone be able to contact him?

## 2016-12-18 ENCOUNTER — Other Ambulatory Visit: Payer: Self-pay | Admitting: Family Medicine

## 2016-12-18 DIAGNOSIS — I10 Essential (primary) hypertension: Secondary | ICD-10-CM

## 2016-12-20 ENCOUNTER — Other Ambulatory Visit: Payer: Self-pay | Admitting: *Deleted

## 2016-12-20 MED ORDER — ROSUVASTATIN CALCIUM 20 MG PO TABS: ORAL_TABLET | ORAL | 3 refills | Status: DC

## 2017-01-10 ENCOUNTER — Ambulatory Visit (AMBULATORY_SURGERY_CENTER): Payer: Self-pay

## 2017-01-10 ENCOUNTER — Other Ambulatory Visit: Payer: Self-pay

## 2017-01-10 VITALS — Ht 75.0 in | Wt 249.2 lb

## 2017-01-10 DIAGNOSIS — Z1211 Encounter for screening for malignant neoplasm of colon: Secondary | ICD-10-CM

## 2017-01-10 MED ORDER — PEG-KCL-NACL-NASULF-NA ASC-C 140 G PO SOLR: 1.0000 | Freq: Once | ORAL | 0 refills | Status: AC

## 2017-01-10 NOTE — Progress Notes (Signed)
Denies allergies to eggs or soy products. Denies complication of anesthesia or sedation. Denies use of weight loss medication. Denies use of O2.   Emmi instructions declined.  

## 2017-01-14 ENCOUNTER — Encounter: Payer: Self-pay | Admitting: Gastroenterology

## 2017-01-18 ENCOUNTER — Ambulatory Visit (AMBULATORY_SURGERY_CENTER): Payer: 59 | Admitting: Gastroenterology

## 2017-01-18 ENCOUNTER — Encounter: Payer: Self-pay | Admitting: Gastroenterology

## 2017-01-18 ENCOUNTER — Other Ambulatory Visit: Payer: Self-pay

## 2017-01-18 VITALS — BP 126/73 | HR 62 | Temp 98.6°F | Resp 13 | Ht 75.0 in | Wt 242.0 lb

## 2017-01-18 DIAGNOSIS — Z1211 Encounter for screening for malignant neoplasm of colon: Secondary | ICD-10-CM

## 2017-01-18 DIAGNOSIS — D123 Benign neoplasm of transverse colon: Secondary | ICD-10-CM

## 2017-01-18 HISTORY — PX: COLONOSCOPY W/ POLYPECTOMY: SHX1380

## 2017-01-18 MED ORDER — SODIUM CHLORIDE 0.9 % IV SOLN: 500.0000 mL | Freq: Once | INTRAVENOUS | Status: DC

## 2017-01-18 NOTE — Progress Notes (Signed)
Report to PACU, RN, vss, BBS= Clear.  

## 2017-01-18 NOTE — Patient Instructions (Signed)
YOU HAD AN ENDOSCOPIC PROCEDURE TODAY AT [facility]:   Refer to the procedure report that was given to you for any specific questions about what was found during the examination.  If the procedure report does not answer your questions, please call your gastroenterologist to clarify.  If you requested that your care partner not be given the details of your procedure findings, then the procedure report has been included in a sealed envelope for you to review at your convenience later.  YOU SHOULD EXPECT: Some feelings of bloating in the abdomen. Passage of more gas than usual.  Walking can help get rid of the air that was put into your GI tract during the procedure and reduce the bloating. If you had a lower endoscopy (such as a colonoscopy or flexible sigmoidoscopy) you may notice spotting of blood in your stool or on the toilet paper. If you underwent a bowel prep for your procedure, you may not have a normal bowel movement for a few days.  Please Note:  You might notice some irritation and congestion in your nose or some drainage.  This is from the oxygen used during your procedure.  There is no need for concern and it should clear up in a day or so.  SYMPTOMS TO REPORT IMMEDIATELY:   Following lower endoscopy (colonoscopy or flexible sigmoidoscopy):  Excessive amounts of blood in the stool  Significant tenderness or worsening of abdominal pains  Swelling of the abdomen that is new, acute  Fever of 100F or higher  For urgent or emergent issues, a gastroenterologist can be reached at any hour by calling [phone].   DIET:  We do recommend a small meal at first, but then you may proceed to your regular diet.  Drink plenty of fluids but you should avoid alcoholic beverages for 24 hours.  ACTIVITY:  You should plan to take it easy for the rest of today and you should NOT DRIVE or use heavy machinery until tomorrow (because of the sedation medicines used during the test).     FOLLOW UP: Our staff will call the number listed on your records the next business day following your procedure to check on you and address any questions or concerns that you may have regarding the information given to you following your procedure. If we do not reach you, we will leave a message.  However, if you are feeling well and you are not experiencing any problems, there is no need to return our call.  We will assume that you have returned to your regular daily activities without incident.  If any biopsies were taken you will be contacted by phone or by letter within the next 1-3 weeks.  Please call us at [phone] if you have not heard about the biopsies in 3 weeks.   Await for biopsy results to determine next repeat Colonoscopy screening Polyps (handout given) Hemorrhoids (handout given) Diverticulosis (handout given)   SIGNATURES/CONFIDENTIALITY: You and/or your care partner have signed paperwork which will be entered into your electronic medical record.  These signatures attest to the fact that that the information above on your After Visit Summary has been reviewed and is understood.  Full responsibility of the confidentiality of this discharge information lies with you and/or your care-partner.

## 2017-01-18 NOTE — Progress Notes (Signed)
Called to room to assist during endoscopic procedure.  Patient ID and intended procedure confirmed with present staff. Received instructions for my participation in the procedure from the performing physician.  

## 2017-01-18 NOTE — Op Note (Signed)
[name] Procedure Date: 01/18/2017 8:13 AM MRN: [id number] Endoscopist: Mauri Pole , MD Age: 61 Referring MD:  Date of Birth: 1955-08-29 Gender: Male Account #: [id number] Procedure:                Colonoscopy Indications:              Screening for colorectal malignant neoplasm, Last                            colonoscopy: 2008 Medicines:                Monitored Anesthesia Care Procedure:                Pre-Anesthesia Assessment:                           - Prior to the procedure, a History and Physical                            was performed, and patient medications and                            allergies were reviewed. The patient's tolerance of                            previous anesthesia was also reviewed. The risks                            and benefits of the procedure and the sedation                            options and risks were discussed with the patient.                            All questions were answered, and informed consent                            was obtained. Prior Anticoagulants: The patient has                            taken no previous anticoagulant or antiplatelet                            agents. ASA Grade Assessment: II - A patient with                            mild systemic disease. After reviewing the risks                            and benefits, the patient was deemed in                            satisfactory condition to undergo the procedure.  After obtaining informed consent, the colonoscope                            was passed under direct vision. Throughout the                            procedure, the patient's blood pressure, pulse, and                            oxygen saturations were monitored continuously. The                            Colonoscope was introduced through the anus and                            advanced to the the cecum, identified by                            appendiceal orifice and ileocecal valve. The                            colonoscopy was performed without difficulty. The                            patient tolerated the procedure well. The quality                            of the bowel preparation was excellent. The                            ileocecal valve, appendiceal orifice, and rectum                            were photographed. Scope In: 8:15:18 AM Scope Out: 8:27:05 AM Scope Withdrawal Time: 0 hours 8 minutes 8 seconds  Total Procedure Duration: 0 hours 11 minutes 47 seconds  Findings:                 The perianal and digital rectal examinations were                            normal.                           There was a small lipoma, 7 mm in diameter, in the                            ascending colon.                           Two sessile polyps were found in the transverse                            colon. The polyps were 1 to 2 mm in size. These  polyps were removed with a cold biopsy forceps.                            Resection and retrieval were complete.                           A few small-mouthed diverticula were found in the                            sigmoid colon.                           Non-bleeding internal hemorrhoids were found during                            retroflexion. The hemorrhoids were small. Complications:            No immediate complications. Estimated Blood Loss:     Estimated blood loss was minimal. Impression:               - Small lipoma in the ascending colon.                           - Two 1 to 2 mm polyps in the transverse colon,                            removed with a cold biopsy forceps. Resected and                            retrieved.                           - Diverticulosis in the sigmoid colon.                           - Non-bleeding internal hemorrhoids. Recommendation:           - Patient has a contact number available  for                            emergencies. The signs and symptoms of potential                            delayed complications were discussed with the                            patient. Return to normal activities tomorrow.                            Written discharge instructions were provided to the                            patient.                           - Resume previous diet.                           -  Continue present medications.                           - Await pathology results.                           - Repeat colonoscopy in 5-10 years for surveillance                            based on pathology results. Mauri Pole, MD 01/18/2017 8:33:53 AM This report has been signed electronically.

## 2017-01-18 NOTE — Progress Notes (Signed)
Pt's states no medical or surgical changes since previsit or office visit. 

## 2017-01-21 ENCOUNTER — Telehealth: Payer: Self-pay | Admitting: *Deleted

## 2017-01-21 NOTE — Telephone Encounter (Signed)
  Follow up Call-  Call back number 01/18/2017  Post procedure Call Back phone  # [phone]  Permission to leave phone message Yes  Some recent data might be hidden     Patient questions:  Do you have a fever, pain , or abdominal swelling? No. Pain Score  0 *  Have you tolerated food without any problems? Yes.    Have you been able to return to your normal activities? Yes.    Do you have any questions about your discharge instructions: Diet   No. Medications  No. Follow up visit  No.  Do you have questions or concerns about your Care? No.  Actions: * If pain score is 4 or above: No action needed, pain <4.

## 2017-01-31 ENCOUNTER — Encounter: Payer: Self-pay | Admitting: Gastroenterology

## 2017-02-01 ENCOUNTER — Encounter: Payer: Self-pay | Admitting: Family Medicine

## 2017-02-01 DIAGNOSIS — Z86010 Personal history of colonic polyps: Secondary | ICD-10-CM | POA: Insufficient documentation

## 2017-02-04 ENCOUNTER — Other Ambulatory Visit: Payer: Self-pay

## 2017-02-04 ENCOUNTER — Telehealth: Payer: Self-pay | Admitting: Family Medicine

## 2017-02-04 MED ORDER — BENAZEPRIL HCL 40 MG PO TABS: 40.0000 mg | ORAL_TABLET | Freq: Every day | ORAL | 1 refills | Status: DC

## 2017-02-04 NOTE — Telephone Encounter (Signed)
MEDICATION:  benazepril (LOTENSIN) 40 MG tablet  PHARMACY:   [facility] [address] [phone] (Phone) [phone] (Fax)    IS THIS A 90 DAY SUPPLY : N  IS PATIENT OUT OF MEDICATION: Y  IF NOT; HOW MUCH IS LEFT:   LAST APPOINTMENT DATE: @11 /15/2018  NEXT APPOINTMENT DATE: 11/25/17  OTHER COMMENTS:    **Let patient know to contact pharmacy at the end of the day to make sure medication is ready. **  ** Please notify patient to allow 48-72 hours to process**  **Encourage patient to contact the pharmacy for refills or they can request refills through [facility]**

## 2017-02-04 NOTE — Telephone Encounter (Signed)
Prescription sent to pharmacy as requested.

## 2017-02-27 DIAGNOSIS — L718 Other rosacea: Secondary | ICD-10-CM | POA: Diagnosis not present

## 2017-03-02 IMAGING — CR DG KNEE COMPLETE 4+V*R*
3 series · 3 of 3 positions shown · non-contrast
Comparison: No prior.

CLINICAL DATA: Chronic knee pain.  No injury.  Initial evaluation .

EXAM:
RIGHT KNEE - COMPLETE 4+ VIEW

[w knee ap right (1 of 2)]
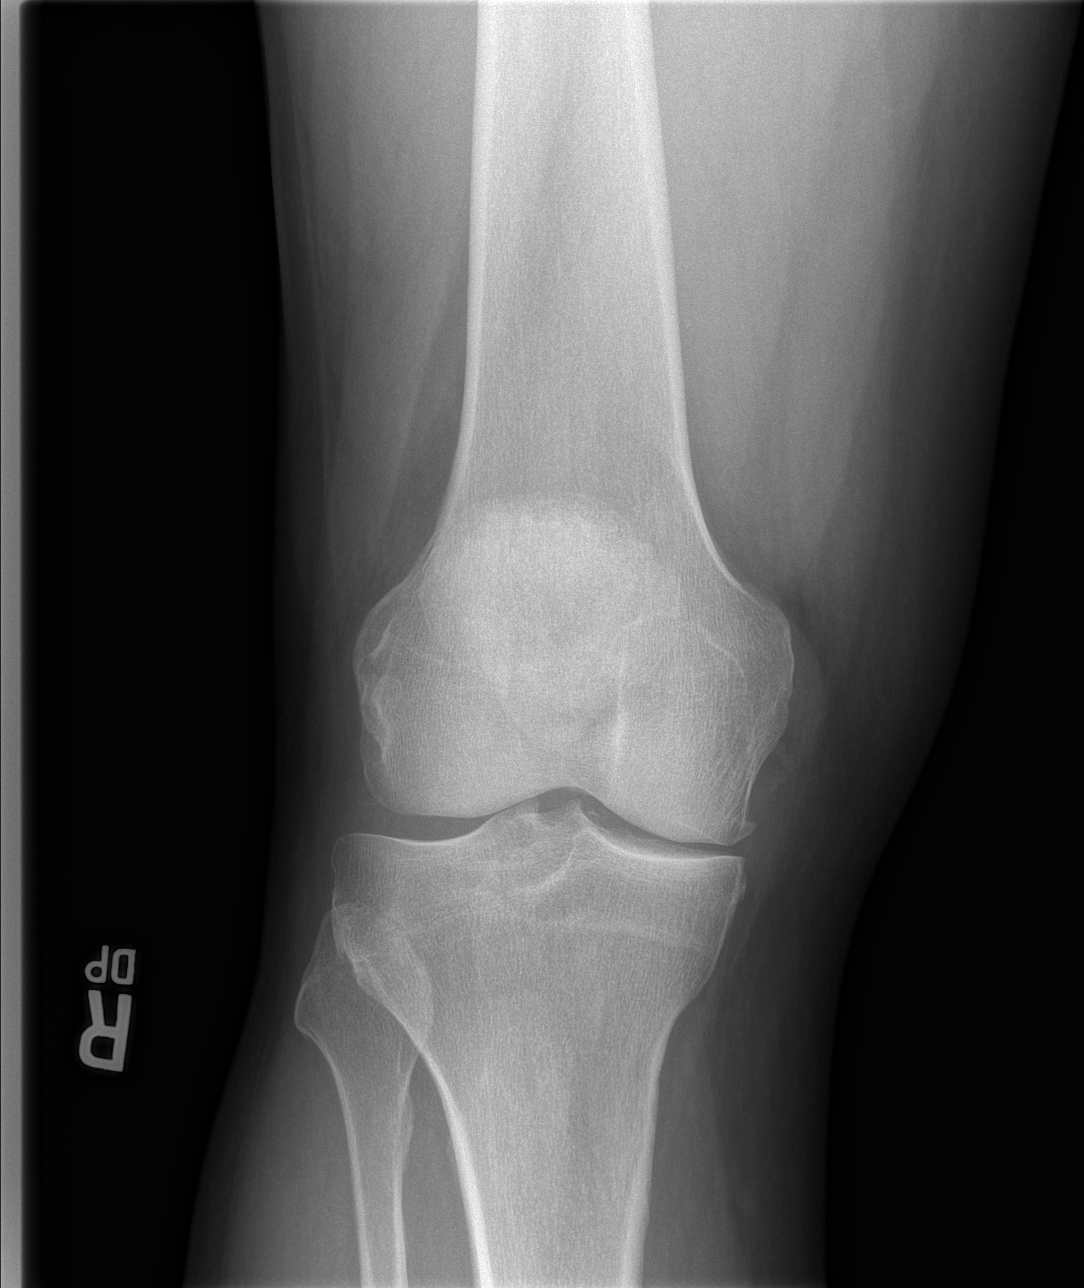

[w knee ap right (2 of 2)]
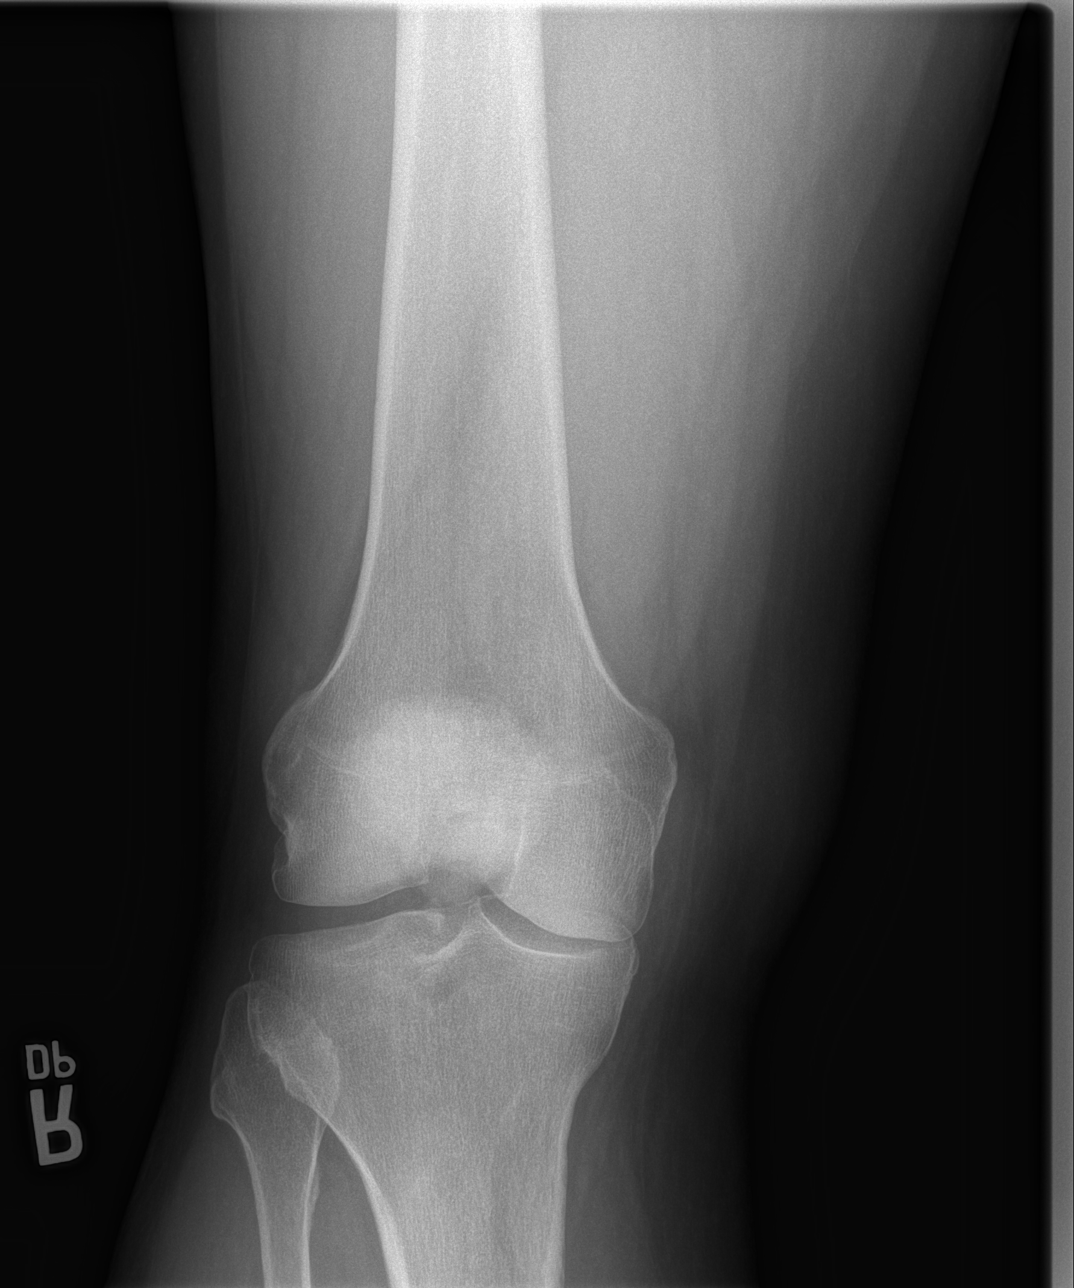

[w knee lat. right]
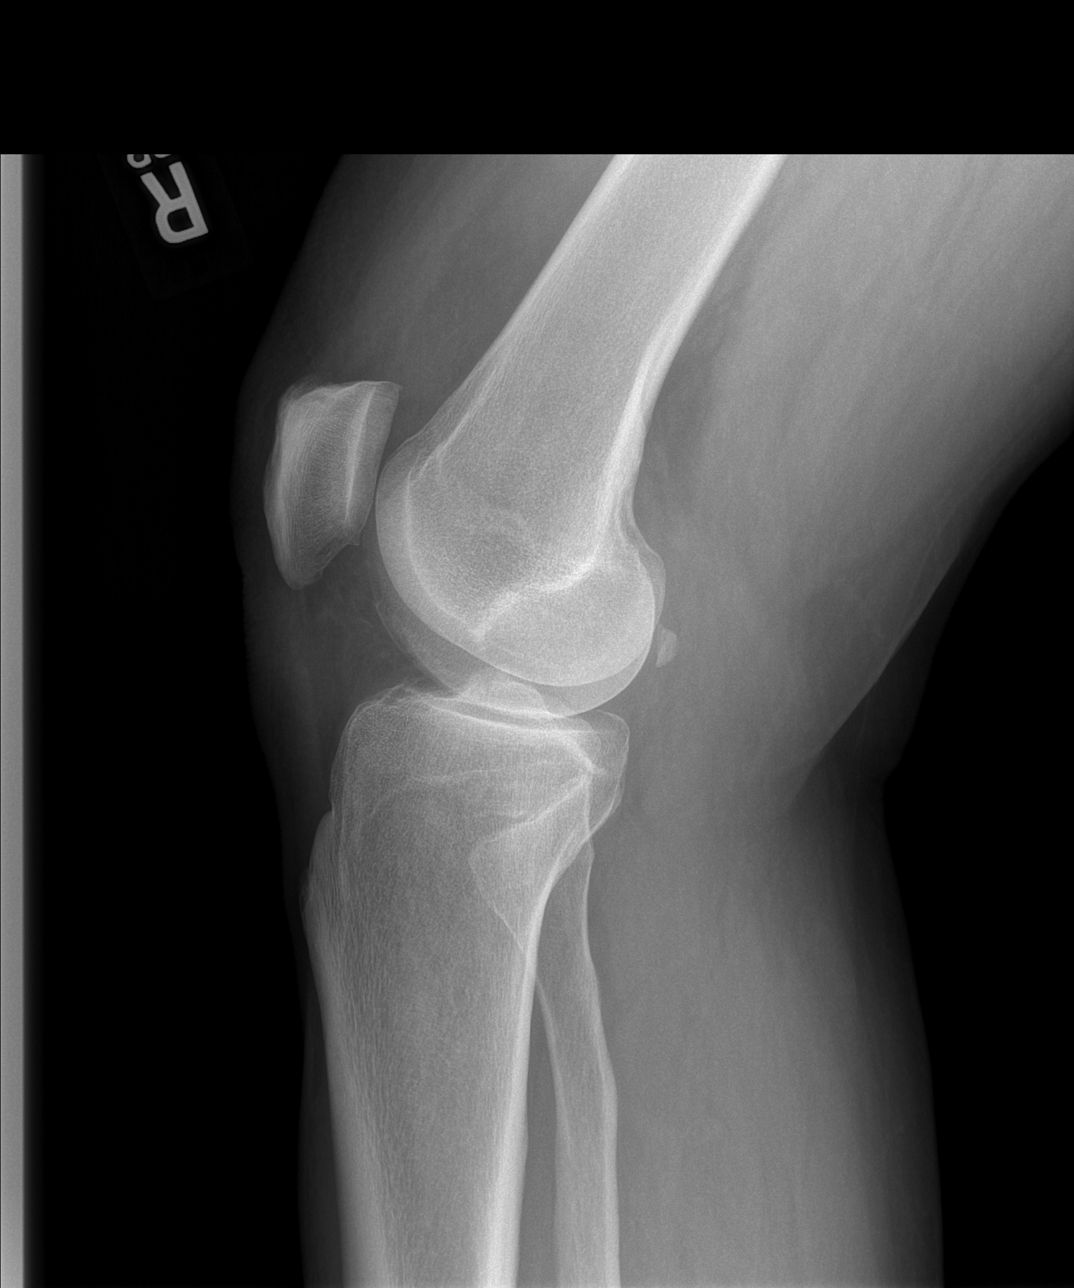

[3 of 3 positions shown; findings below may reference images not displayed]

FINDINGS: No acute bony or joint abnormality identified. Mild medial and
patellofemoral compartment degenerative change. Small loose body
cannot be excluded .
IMPRESSION: Mild degenerative changes right knee.  No acute abnormality.

## 2017-03-13 ENCOUNTER — Other Ambulatory Visit: Payer: Self-pay | Admitting: Family Medicine

## 2017-03-13 DIAGNOSIS — I10 Essential (primary) hypertension: Secondary | ICD-10-CM

## 2017-04-24 ENCOUNTER — Other Ambulatory Visit: Payer: Self-pay | Admitting: Family Medicine

## 2017-04-24 DIAGNOSIS — I10 Essential (primary) hypertension: Secondary | ICD-10-CM

## 2017-05-01 DIAGNOSIS — C44329 Squamous cell carcinoma of skin of other parts of face: Secondary | ICD-10-CM | POA: Diagnosis not present

## 2017-08-11 ENCOUNTER — Other Ambulatory Visit: Payer: Self-pay | Admitting: Family Medicine

## 2017-09-06 DIAGNOSIS — L570 Actinic keratosis: Secondary | ICD-10-CM | POA: Diagnosis not present

## 2017-09-06 DIAGNOSIS — L718 Other rosacea: Secondary | ICD-10-CM | POA: Diagnosis not present

## 2017-09-06 DIAGNOSIS — Z85828 Personal history of other malignant neoplasm of skin: Secondary | ICD-10-CM | POA: Diagnosis not present

## 2017-09-12 DIAGNOSIS — Z471 Aftercare following joint replacement surgery: Secondary | ICD-10-CM | POA: Diagnosis not present

## 2017-09-13 ENCOUNTER — Ambulatory Visit: Payer: 59 | Admitting: Family Medicine

## 2017-09-13 ENCOUNTER — Telehealth: Payer: Self-pay | Admitting: Family Medicine

## 2017-09-13 DIAGNOSIS — M25562 Pain in left knee: Secondary | ICD-10-CM | POA: Diagnosis not present

## 2017-09-13 NOTE — Telephone Encounter (Signed)
Patient was seen at [facility] this morning. They drew fluid off his knee and prescribed medication to help with pain. He will follow up with [facility] in 3 weeks

## 2017-09-13 NOTE — Telephone Encounter (Signed)
Copied from [place] [id number]. Topic: Inquiry >> Sep 09, 2017 10:19 AM Ahmed Prima L wrote: Reason for CRM:  Patient called and stated he is having some left knee pain and is limping. He said that he had seen a sports medicine doctor before for his right knee but could not remember who it was. He wanted to know should he just come in and see Dr Yong Channel for his appointment I made him for Friday 8/9 or go ahead and see Dr Raeford Razor or Dr Tamala Julian at [facility]. He said he really wanted Dr Ansel Bong recommendation for this. Please contact patient [phone]  Please follow up on this.

## 2017-09-13 NOTE — Telephone Encounter (Signed)
Called and left a voicemail message asking for a return phone call as patient was scheduled for 8:45 this morning and the appointment was cancelled. Does he want to reschedule with [facility]?

## 2017-10-04 DIAGNOSIS — M25562 Pain in left knee: Secondary | ICD-10-CM | POA: Diagnosis not present

## 2017-10-14 DIAGNOSIS — M25562 Pain in left knee: Secondary | ICD-10-CM | POA: Diagnosis not present

## 2017-10-30 DIAGNOSIS — M25562 Pain in left knee: Secondary | ICD-10-CM | POA: Diagnosis not present

## 2017-11-18 ENCOUNTER — Other Ambulatory Visit: Payer: Self-pay | Admitting: Family Medicine

## 2017-11-21 ENCOUNTER — Other Ambulatory Visit: Payer: Self-pay | Admitting: Family Medicine

## 2017-11-25 ENCOUNTER — Ambulatory Visit (INDEPENDENT_AMBULATORY_CARE_PROVIDER_SITE_OTHER): Payer: 59 | Admitting: Family Medicine

## 2017-11-25 ENCOUNTER — Encounter: Payer: Self-pay | Admitting: Family Medicine

## 2017-11-25 VITALS — BP 128/82 | HR 69 | Temp 97.8°F | Ht 75.0 in | Wt 250.6 lb

## 2017-11-25 DIAGNOSIS — S838X2A Sprain of other specified parts of left knee, initial encounter: Secondary | ICD-10-CM

## 2017-11-25 DIAGNOSIS — I10 Essential (primary) hypertension: Secondary | ICD-10-CM

## 2017-11-25 DIAGNOSIS — Z125 Encounter for screening for malignant neoplasm of prostate: Secondary | ICD-10-CM

## 2017-11-25 DIAGNOSIS — Z0000 Encounter for general adult medical examination without abnormal findings: Secondary | ICD-10-CM

## 2017-11-25 DIAGNOSIS — E782 Mixed hyperlipidemia: Secondary | ICD-10-CM | POA: Diagnosis not present

## 2017-11-25 DIAGNOSIS — Z85828 Personal history of other malignant neoplasm of skin: Secondary | ICD-10-CM

## 2017-11-25 MED ORDER — ROSUVASTATIN CALCIUM 20 MG PO TABS: ORAL_TABLET | ORAL | 3 refills | Status: DC

## 2017-11-25 MED ORDER — AMLODIPINE BESYLATE 2.5 MG PO TABS: 2.5000 mg | ORAL_TABLET | Freq: Every day | ORAL | 11 refills | Status: DC

## 2017-11-25 MED ORDER — BENAZEPRIL HCL 40 MG PO TABS: 40.0000 mg | ORAL_TABLET | Freq: Every day | ORAL | 11 refills | Status: DC

## 2017-11-25 MED ORDER — TOPROL XL 50 MG PO TB24: ORAL_TABLET | ORAL | 11 refills | Status: DC

## 2017-11-25 NOTE — Progress Notes (Signed)
Phone: [phone]  Subjective:  Patient presents today for their annual physical. Chief complaint-noted.   See problem oriented charting- ROS- full  review of systems was completed and negative including No chest pain or shortness of breath. No headache or blurry vision.    The following were reviewed and entered/updated in epic: Past Medical History:  Diagnosis Date  . DDD (degenerative disc disease)   . Degenerative disc disease, lumbar   . Degenerative disc disease, lumbar   . Heart murmur   . History of skin cancer    nose- with Dr. Ubaldo Glassing   . Hyperlipidemia    LDL goal < 100, ideally < 70  . Hypertension   . Meniscal injury, left, initial encounter    [facility] following- has a flap. will wait on surgery until cannot bear it.    Patient Active Problem List   Diagnosis Date Noted  . History of adenomatous polyp of colon 02/01/2017    Priority: Medium  . Essential hypertension 12/30/2007    Priority: Medium  . HYPERLIPIDEMIA 11/19/2006    Priority: Medium  . Tobacco use disorder 07/09/2015    Priority: Low  . MVP (mitral valve prolapse) 07/08/2015    Priority: Low  . DDD (degenerative disc disease)     Priority: Low  . Hx of gout 12/30/2007    Priority: Low  . Meniscal injury, left, initial encounter   . History of skin cancer    Past Surgical History:  Procedure Laterality Date  . COLONOSCOPY  2007   negative, [facility]  . KNEE SURGERY     R knee torn ligament  . POLYPECTOMY      Family History  Problem Relation Age of Onset  . Hypertension Father   . Mitral valve prolapse Father   . Lymphoma Father        mantel cell  . Glaucoma Father   . Macular degeneration Father   . Benign prostatic hyperplasia Father   . Diabetes Father   . Hypertension Mother   . Heart disease Mother        valvular heart disease  . Heart attack Mother        based on findings @ valve replacement  . Migraines Mother   . Heart attack Maternal Grandfather          in 43s  . Heart disease Maternal Grandfather   . Diabetes Paternal Grandmother   . Hypertension Paternal Grandmother   . Alcohol abuse Son   . Heart disease Maternal Grandmother   . Stroke Neg Hx   . Colon cancer Neg Hx   . Stomach cancer Neg Hx   . Rectal cancer Neg Hx     Medications- reviewed and updated Current Outpatient Medications  Medication Sig Dispense Refill  . amLODipine (NORVASC) 2.5 MG tablet Take 1 tablet (2.5 mg total) by mouth daily. 30 tablet 11  . benazepril (LOTENSIN) 40 MG tablet Take 1 tablet (40 mg total) by mouth daily. 30 tablet 11  . metroNIDAZOLE (METROCREAM) 0.75 % cream APP EXT AA ON FACE BID  11  . Multiple Vitamins-Minerals (MEGA MULTI MEN PO) Take by mouth daily.     . rosuvastatin (CRESTOR) 20 MG tablet take 1 tablet by mouth MONDAY WEDNESDAY AND FRIDAY 36 tablet 3  . TOPROL XL 50 MG 24 hr tablet Take with or immediately following a meal. 45 tablet 11   No current facility-administered medications for this visit.     Allergies-reviewed and updated Allergies  Allergen Reactions  . Penicillins     Rash @ 14; he has had Ampicillin since w/o adverse  effect    Social History   Social History Narrative   Married 37 years in 2017. 2 children 36 and 12. 4 grandkids (1 on way included) in 2017.       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 Oropharynx normal Neck: no thyromegaly CV: RRR no murmurs rubs or gallops Lungs: CTAB no crackles, wheeze, rhonchi Abdomen: soft/nontender/nondistended/normal bowel sounds.  No rebound or guarding. overweight Ext: no edema Skin: warm, dry Neuro: grossly normal, moves all extremities, PERRLA   Assessment/Plan:  62 y.o. male presenting for annual physical.  Health Maintenance counseling: 1. Anticipatory guidance: Patient counseled regarding regular dental exams -q6 months, eye exams yearly, wearing seatbelts.  2. Risk factor reduction:  Advised patient of need for regular exercise and diet rich and fruits and vegetables to reduce risk of heart attack and stroke. Exercise- very active- doing yoga weekly, stretching daily, exercise 3-4 days a week. Diet-last year he menitoned possibly cutting down on alcohol to redue liquid calories  (wine 3-4 days a week pretty much only) and assist with weight loss. BMI is in obese range- does have good muscle mass for age though so may overestimate. We discussed improving diet- he is open to meeting with Inda Coke to make this work. He is working on increased water intake as knee seems to do better with this. His ideal desire would be to be at 220.  Wt Readings from Last 3 Encounters:  11/25/17 250 lb 9.6 oz (113.7 kg). 241-245 on home scales.   01/18/17 242 lb (109.8 kg)  01/10/17 249 lb 3.2 oz (113 kg)  3. Immunizations/screenings/ancillary studies-declines flu shot and Shingrix (will consider shingrix at later date).  He declined hepatitis C and HIV screen last year Immunization History  Administered Date(s) Administered  . Tdap 03/03/2012, 07/08/2015  4. Prostate cancer screening- low risk PSA trend.  Will trend again today.  Opts out of rectal exam unless PSA trend. BPH in dad- no cancer history.  Lab Results  Component Value Date   PSA 1.23 11/19/2016   PSA 1.23 07/06/2015   PSA 1.06 06/29/2010   5. Colon cancer screening -had colonoscopy December 2018-had adenomatous polyp and due for 5-year follow-up 6. Skin cancer screening-referred back to Dr. Ubaldo Glassing last year as he had several actinic keratosis and we did not  have cryotherapy available at that time.  Advised regular sunscreen use. Denies worrisome, changing, or new skin lesions.   Status of chronic or acute concerns   Hypertension-controlled on amlodipine 2.5 mg, and benazepril 40 mg, Toprol-XL 50 mg. Generic toprol caused chronic cough.   Hyperlipidemia- LDL reasonably controlled on rosuvastatin 20 mg Monday Wednesday Friday.  We have discussed reducing dose if LDL gets under 70  History of gout-no recent episodes.  Organic cherry juice in the past with flareups has helped him  I do not detect  mitral valve prolapse changes on cardiac exam that Dr. Linna Darner had previously recognized  Remained cigarette free since 2017. Also no dip/chew- excellent progress.   Rosacea being treated by Dr. Ubaldo Glassing.   Return in about 1 year (around 11/26/2018) for physical.  Lab/Order associations: Preventative health care - Plan: CBC, Comprehensive metabolic panel, Lipid panel, PSA, POCT Urinalysis Dipstick (Automated)  Essential hypertension - Plan: TOPROL XL 50 MG 24 hr tablet, CBC, Comprehensive metabolic panel, Lipid panel, POCT Urinalysis Dipstick (Automated)  HYPERLIPIDEMIA - Plan: CBC, Comprehensive metabolic panel, Lipid panel, POCT Urinalysis Dipstick (Automated)  Meniscal injury, left, initial encounter  History of skin cancer  Screening for prostate cancer - Plan: PSA  Meds ordered this encounter  Medications  . TOPROL XL 50 MG 24 hr tablet    Sig: Take with or immediately following a meal.    Dispense:  45 tablet    Refill:  11  . rosuvastatin (CRESTOR) 20 MG tablet    Sig: take 1 tablet by mouth MONDAY WEDNESDAY AND FRIDAY    Dispense:  36 tablet    Refill:  3  . benazepril (LOTENSIN) 40 MG tablet    Sig: Take 1 tablet (40 mg total) by mouth daily.    Dispense:  30 tablet    Refill:  11    Must come to appt for further refills.  Marland Kitchen amLODipine (NORVASC) 2.5 MG tablet    Sig: Take 1 tablet (2.5 mg total) by mouth daily.    Dispense:  30  tablet    Refill:  11    Return precautions advised.  Garret Reddish, MD

## 2017-11-25 NOTE — Patient Instructions (Addendum)
Declined flu and shingrix shot for now  See back in a year for physical as long as blood pressures at home less than 140/90-would ask you to check at least once a month whether with home cuff or at pharmacy  Schedule a lab visit at the check out desk within 2 weeks. Return for future fasting labs meaning nothing but water after midnight please. Ok to take your medications with water.   Please schedule a visit with Inda Coke, PA and RD- for an hour long visit. Please have your wife Alyse Low come with you- she may want to opt for a visit in the future as well with Samantha.   Team- please give him a copy of the homework.

## 2017-11-25 NOTE — Addendum Note (Signed)
Addended by: Marin Olp on: 11/25/2017 09:00 AM   Modules accepted: Level of Service

## 2017-12-06 ENCOUNTER — Other Ambulatory Visit: Payer: Self-pay

## 2017-12-06 DIAGNOSIS — I10 Essential (primary) hypertension: Secondary | ICD-10-CM

## 2017-12-06 MED ORDER — TOPROL XL 50 MG PO TB24: ORAL_TABLET | ORAL | 11 refills | Status: DC

## 2017-12-10 ENCOUNTER — Other Ambulatory Visit (INDEPENDENT_AMBULATORY_CARE_PROVIDER_SITE_OTHER): Payer: 59

## 2017-12-10 DIAGNOSIS — Z0000 Encounter for general adult medical examination without abnormal findings: Secondary | ICD-10-CM | POA: Diagnosis not present

## 2017-12-10 DIAGNOSIS — Z125 Encounter for screening for malignant neoplasm of prostate: Secondary | ICD-10-CM

## 2017-12-10 DIAGNOSIS — I10 Essential (primary) hypertension: Secondary | ICD-10-CM

## 2017-12-10 DIAGNOSIS — E782 Mixed hyperlipidemia: Secondary | ICD-10-CM

## 2017-12-10 LAB — COMPREHENSIVE METABOLIC PANEL
ALBUMIN: 4.6 g/dL (ref 3.5–5.2)
ALT: 25 U/L (ref 0–53)
AST: 19 U/L (ref 0–37)
Alkaline Phosphatase: 89 U/L (ref 39–117)
BUN: 14 mg/dL (ref 6–23)
CALCIUM: 9.2 mg/dL (ref 8.4–10.5)
CHLORIDE: 103 meq/L (ref 96–112)
CO2: 29 mEq/L (ref 19–32)
CREATININE: 0.92 mg/dL (ref 0.40–1.50)
GFR: 88.37 mL/min (ref >60.00)
Glucose, Bld: 85 mg/dL (ref 70–99)
Potassium: 4.6 mEq/L (ref 3.5–5.1)
Sodium: 140 mEq/L (ref 135–145)
Total Bilirubin: 0.7 mg/dL (ref 0.2–1.2)
Total Protein: 7.3 g/dL (ref 6.0–8.3)

## 2017-12-10 LAB — POC URINALSYSI DIPSTICK (AUTOMATED)
Bilirubin, UA: NEGATIVE
Blood, UA: NEGATIVE
Glucose, UA: NEGATIVE
Ketones, UA: NEGATIVE
LEUKOCYTES UA: NEGATIVE
NITRITE UA: NEGATIVE
PROTEIN UA: NEGATIVE
Spec Grav, UA: 1.015 (ref 1.010–1.025)
Urobilinogen, UA: 0.2 E.U./dL
pH, UA: 7 (ref 5.0–8.0)

## 2017-12-10 LAB — LIPID PANEL
Cholesterol: 148 mg/dL (ref 0–200)
HDL: 50.4 mg/dL (ref >39.00)
LDL CALC: 77 mg/dL (ref 0–99)
NonHDL: 97.33
TRIGLYCERIDES: 102 mg/dL (ref 0.0–149.0)
Total CHOL/HDL Ratio: 3
VLDL: 20.4 mg/dL (ref 0.0–40.0)

## 2017-12-10 LAB — CBC
HEMATOCRIT: 40.9 % (ref 39.0–52.0)
Hemoglobin: 14.1 g/dL (ref 13.0–17.0)
MCHC: 34.5 g/dL (ref 30.0–36.0)
MCV: 88.5 fl (ref 78.0–100.0)
PLATELETS: 301 10*3/uL (ref 150.0–400.0)
RBC: 4.63 Mil/uL (ref 4.22–5.81)
RDW: 12.7 % (ref 11.5–15.5)
WBC: 6.7 10*3/uL (ref 4.0–10.5)

## 2017-12-10 LAB — PSA: PSA: 1.18 ng/mL (ref 0.10–4.00)

## 2017-12-12 ENCOUNTER — Telehealth: Payer: Self-pay | Admitting: *Deleted

## 2017-12-12 NOTE — Telephone Encounter (Signed)
I know patient is trying to work to improve on his lifestyle choices.  If he would like to trial twice a week over the next year of his statin medication while focusing on regular exercise and healthy eating and hopefully associated weight loss-that is reasonable

## 2017-12-12 NOTE — Telephone Encounter (Signed)
See note

## 2017-12-12 NOTE — Telephone Encounter (Signed)
Patient has a question for PCP:  During lab notification patient had one question: He is currently taking statin medication and he takes 3 times a week. He wants to know if he would still get benefit taking it 2 times a week? It is not a deal breaker for him- he has been doing some reading and feels less is better with statins and just wants to know how Dr Yong Channel feels about this. May leave message on VM.  Also requested copy of labs for his records- note made on labs.

## 2017-12-16 ENCOUNTER — Ambulatory Visit: Payer: 59 | Admitting: Physician Assistant

## 2017-12-16 NOTE — Telephone Encounter (Signed)
Called and spoke with patient. Reviewed Dr Ronney Lion instructions. Patient stated understanding and agrees with plan.

## 2017-12-17 ENCOUNTER — Other Ambulatory Visit: Payer: Self-pay | Admitting: Family Medicine

## 2017-12-17 DIAGNOSIS — I10 Essential (primary) hypertension: Secondary | ICD-10-CM

## 2017-12-19 ENCOUNTER — Other Ambulatory Visit: Payer: Self-pay | Admitting: Family Medicine

## 2017-12-24 ENCOUNTER — Ambulatory Visit: Payer: 59 | Admitting: Physician Assistant

## 2017-12-24 ENCOUNTER — Ambulatory Visit: Payer: 59 | Admitting: Family Medicine

## 2017-12-24 ENCOUNTER — Encounter: Payer: Self-pay | Admitting: Physician Assistant

## 2017-12-24 VITALS — BP 126/78 | HR 75 | Temp 98.5°F | Ht 75.0 in | Wt 248.4 lb

## 2017-12-24 DIAGNOSIS — Z713 Dietary counseling and surveillance: Secondary | ICD-10-CM

## 2017-12-24 DIAGNOSIS — I10 Essential (primary) hypertension: Secondary | ICD-10-CM

## 2017-12-24 DIAGNOSIS — E669 Obesity, unspecified: Secondary | ICD-10-CM

## 2017-12-24 DIAGNOSIS — R05 Cough: Secondary | ICD-10-CM

## 2017-12-24 DIAGNOSIS — R059 Cough, unspecified: Secondary | ICD-10-CM

## 2017-12-24 MED ORDER — AZITHROMYCIN 250 MG PO TABS: ORAL_TABLET | ORAL | 0 refills | Status: DC

## 2017-12-24 MED ORDER — BENZONATATE 200 MG PO CAPS: 200.0000 mg | ORAL_CAPSULE | Freq: Two times a day (BID) | ORAL | 0 refills | Status: DC | PRN

## 2017-12-24 NOTE — Progress Notes (Signed)
Bradley Cole. is a 62 y.o. male here for Nutrition Consult  I acted as a Education administrator for [employer], PA-C Anselmo Pickler, LPN  History of Present Illness:   Chief Complaint  Patient presents with  . Nutrition Counseling    HPI    Cough He has had symptoms for a few days. Wife with similar symptoms that turned into sinus infection and she is now on an antibiotic. He is eating and drinking well. Denies fever. Has an important 1-2 weeks ahead of him in regards to work and is trying to "get ahead of this." He denies CP, SOB. Starting to have a little facial pressure. Has not tried anything for symptoms except cough drops. Cough is non productive.  Nutrition "Went on a diabetic diet" for 3-4 months about 10 years ago and did really well with this, lost several pounds. Has not tried any other dietary regimens since.   Dietary recall: Wakes up at 5am (does yoga and reading) Breakfast 6:30 am-- bowl of oatmeal OR raisin bran OR eggs w/ bacon (1 x week) with coffee Snack 10am -- peanuts (salted) or piece of fruit; hot tea or another coffee Lunch -- 12pm Snack 3pm -- peanuts (salted) and round of cheese; water Dinner 6:30pm -- 50/50 -- Char Bar, Pastatana; Stouffer's meal, pancakes, loves peas Dessert -- banana pudding cake Beverages   --Wine of glass every other night --Bourbon only on weekends --Drinks 2-6 waters daily  Weight: Wt Readings from Last 3 Encounters:  12/24/17 248 lb 6.1 oz (112.7 kg)  11/25/17 250 lb 9.6 oz (113.7 kg)  01/18/17 242 lb (109.8 kg)   Highest weight is 254lb; but staying around 240-250 lb  Body mass index is 31.05 kg/m.   Exercise: Tries to exercise 3-4 x a week; rides horses  Support system: wife  Sleep: good  Goals: 1-  Live longer than grandfather, 56 y/o 2-  Balance out meals 3-  Lose 10-20 lb  Estimated daily energy needs: Calories: 1600-1800 kcal Protein: 60-75 g Fluid: 2000 ml  Past Medical History:  Diagnosis Date   . DDD (degenerative disc disease)   . Degenerative disc disease, lumbar   . Degenerative disc disease, lumbar   . Heart murmur   . History of skin cancer    nose- with Dr. Ubaldo Glassing   . Hyperlipidemia    LDL goal < 100, ideally < 70  . Hypertension   . Meniscal injury, left, initial encounter    [facility] following- has a flap. will wait on surgery until cannot bear it.      Social History   Socioeconomic History  . Marital status: Married    Spouse name: Not on file  . Number of children: Not on file  . Years of education: Not on file  . Highest education level: Not on file  Occupational History  . Not on file  Social Needs  . Financial resource strain: Not on file  . Food insecurity:    Worry: Not on file    Inability: Not on file  . Transportation needs:    Medical: Not on file    Non-medical: Not on file  Tobacco Use  . Smoking status: Former Smoker    Types: Cigars    Last attempt to quit: 11/07/2010    Years since quitting: 7.1  . Smokeless tobacco: Former Systems developer    Types: Chew  . Tobacco comment: quite smoking a year ago.  Substance and Sexual Activity  .  Alcohol use: Yes    Alcohol/week: 3.0 standard drinks    Types: 3 Glasses of wine per week    Comment: socially   . Drug use: No  . Sexual activity: Not on file  Lifestyle  . Physical activity:    Days per week: Not on file    Minutes per session: Not on file  . Stress: Not on file  Relationships  . Social connections:    Talks on phone: Not on file    Gets together: Not on file    Attends religious service: Not on file    Active member of club or organization: Not on file    Attends meetings of clubs or organizations: Not on file    Relationship status: Not on file  . Intimate partner violence:    Fear of current or ex partner: Not on file    Emotionally abused: Not on file    Physically abused: Not on file    Forced sexual activity: Not on file  Other Topics Concern  . Not on file   Social History Narrative   Married 37 years in 2017. 2 children 71 and 50. 4 grandkids (1 on way included) in 2017.       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 COLONOSCOPY  2007   negative,  GI  . KNEE SURGERY     R knee torn ligament  . POLYPECTOMY      Family History  Problem Relation Age of Onset  . Hypertension Father   . Mitral valve prolapse Father   . Lymphoma Father        mantel cell  . Glaucoma Father   . Macular degeneration Father   . Benign prostatic hyperplasia Father   . Diabetes Father   . Hypertension Mother   . Heart disease Mother        valvular heart disease  . Heart attack Mother        based on findings @ valve replacement  . Migraines Mother   . Heart attack Maternal Grandfather        in 6s  . Heart disease Maternal Grandfather   . Diabetes Paternal Grandmother   . Hypertension Paternal Grandmother   . Alcohol abuse Son   . Heart disease Maternal Grandmother   . Stroke Neg Hx   . Colon cancer Neg Hx   . Stomach cancer Neg Hx   . Rectal cancer Neg Hx     Allergies  Allergen Reactions  . Penicillins     Rash @ 14; he has had Ampicillin since w/o adverse  effect    Current Medications:   Current Outpatient Medications:  .  amLODipine (NORVASC) 2.5 MG tablet, Take 1 tablet (2.5 mg total) by mouth daily., Disp: 30 tablet, Rfl: 11 .  benazepril (LOTENSIN) 40 MG tablet, Take 1 tablet (40 mg total) by mouth daily., Disp: 30 tablet, Rfl: 11 .  metroNIDAZOLE (METROCREAM) 0.75 % cream, APP EXT AA ON FACE BID, Disp: , Rfl: 11 .  Multiple Vitamins-Minerals (MEGA MULTI MEN PO), Take by mouth daily. , Disp: , Rfl:  .  rosuvastatin (CRESTOR) 20 MG tablet, take 1 tablet by mouth MONDAY WEDNESDAY AND FRIDAY, Disp: 36 tablet, Rfl: 3 .   TOPROL  XL 50 MG 24 hr tablet, Take with or immediately following a meal., Disp: 30 tablet, Rfl: 11 .  azithromycin (ZITHROMAX) 250 MG tablet, Take two tablets on day 1, and then one tablet daily thereafter, Disp: 6 tablet, Rfl: 0 .  benzonatate (TESSALON) 200 MG capsule, Take 1 capsule (200 mg total) by mouth 2 (two) times daily as needed for cough., Disp: 20 capsule, Rfl: 0   Review of Systems:   ROS Negative unless otherwise specified per HPI.  Vitals:   Vitals:   12/24/17 0736  BP: 126/78  Pulse: 75  Temp: 98.5 F (36.9 C)  TempSrc: Oral  SpO2: 96%  Weight: 248 lb 6.1 oz (112.7 kg)  Height: 6\' 3"  (1.905 m)     Body mass index is 31.05 kg/m.  Physical Exam:   Physical Exam  Constitutional: He appears well-developed. He is cooperative.  Non-toxic appearance. He does not have a sickly appearance. He does not appear ill. No distress.  HENT:  Head: Normocephalic and atraumatic.  Right Ear: Tympanic membrane, external ear and ear canal normal. Tympanic membrane is not erythematous, not retracted and not bulging.  Left Ear: Tympanic membrane, external ear and ear canal normal. Tympanic membrane is not erythematous, not retracted and not bulging.  Nose: Mucosal edema and rhinorrhea present. Right sinus exhibits no maxillary sinus tenderness and no frontal sinus tenderness. Left sinus exhibits no maxillary sinus tenderness and no frontal sinus tenderness.  Mouth/Throat: Uvula is midline and mucous membranes are normal. Posterior oropharyngeal erythema present. No posterior oropharyngeal edema.  Eyes: Conjunctivae and lids are normal.  Neck: Trachea normal.  Cardiovascular: Normal rate, regular rhythm, S1 normal, S2 normal, normal heart sounds and normal pulses.  No LE edema  Pulmonary/Chest: Effort normal and breath sounds normal. He has no decreased breath sounds. He has no wheezes. He has no rhonchi. He has no rales.  Lymphadenopathy:    He has no cervical adenopathy.   Neurological: He is alert. GCS eye subscore is 4. GCS verbal subscore is 5. GCS motor subscore is 6.  Skin: Skin is warm, dry and intact.  Psychiatric: He has a normal mood and affect. His speech is normal and behavior is normal.  Nursing note and vitals reviewed.   Assessment and Plan:    Izen was seen today for nutrition counseling.  Diagnoses and all orders for this visit:  Encounter for nutritional counseling; Essential hypertension; Obesity, unspecified classification, unspecified obesity type, unspecified whether serious comorbidity present Discussed specific, individualized recommendations regarding nutrition including decreasing sugary beverages, limiting portions, balancing out meals, and eating regularly throughout the day. Handouts provided included: Balanced Plate, Balanced Snack List, Balanced Breakfast, 1800 Calorie Sample Menus. Provided emotional support and encouraged slow, steady weight loss. Patient's questions answered throughout encounter. Follow-up with me prn.  Cough No red flags on exam.  Discussed supportive care measures including Delsym. May also try tessalon perles which I have sent in for him. I did give him a "pocket prescription" of azihtromycin should his symptoms not improve with symptomatic care.  Discussed taking medications as prescribed. Reviewed return precautions including worsening fever, SOB, worsening cough or other concerns. Push fluids and rest. I recommend that patient follow-up if symptoms worsen or persist despite treatment x 7-10 days, sooner if needed.  Other orders -     benzonatate (TESSALON) 200 MG capsule; Take 1 capsule (200 mg total) by mouth 2 (two) times daily as needed for cough. -     azithromycin (ZITHROMAX) 250 MG tablet;  Take two tablets on day 1, and then one tablet daily thereafter  . Reviewed expectations re: course of current medical issues. . Discussed self-management of symptoms. . Outlined signs and symptoms indicating  need for more acute intervention. . Patient verbalized understanding and all questions were answered. . See orders for this visit as documented in the electronic medical record. . Patient received an After-Visit Summary.  I spent 40 minutes with this patient, greater than 50% was face-to-face time counseling regarding the above diagnoses.  CMA or LPN served as scribe during this visit. History, Physical, and Plan performed by medical provider. Documentation and orders reviewed and attested to.  Inda Coke, PA-C

## 2017-12-24 NOTE — Patient Instructions (Addendum)
It was great to see you!  Use medication as prescribed: Tessalon perles for cough,   Azithromycin antibiotic if needed  Push fluids and get plenty of rest. Please return if you are not improving as expected, or if you have high fevers (>101.5) or difficulty swallowing or worsening productive cough.  Call clinic with questions.  I hope you start feeling better soon!  Nutrition goals: 1. Smaller wine portions 2. More non-starchy veggies 3. Add carbohydrate to handful of peanuts 4. Drink more water 5. Limit rice and bread portions, try to always choose whole wheat!

## 2017-12-30 ENCOUNTER — Other Ambulatory Visit: Payer: Self-pay | Admitting: Family Medicine

## 2017-12-30 DIAGNOSIS — I10 Essential (primary) hypertension: Secondary | ICD-10-CM

## 2018-01-03 ENCOUNTER — Other Ambulatory Visit: Payer: Self-pay | Admitting: Family Medicine

## 2018-01-03 DIAGNOSIS — I10 Essential (primary) hypertension: Secondary | ICD-10-CM

## 2018-01-09 ENCOUNTER — Other Ambulatory Visit: Payer: Self-pay | Admitting: *Deleted

## 2018-01-09 ENCOUNTER — Telehealth: Payer: Self-pay | Admitting: Family Medicine

## 2018-01-09 DIAGNOSIS — I10 Essential (primary) hypertension: Secondary | ICD-10-CM

## 2018-01-09 MED ORDER — TOPROL XL 50 MG PO TB24: ORAL_TABLET | ORAL | 11 refills | Status: DC

## 2018-01-09 NOTE — Telephone Encounter (Unsigned)
Copied from [place] [id number]. Topic: Quick Communication - Rx Refill/Question >> Jan 09, 2018  3:21 PM Carolyn Stare wrote: Medication   TOPROL XL 50 MG 24 hr tablet       pharmacy never received the rx sent in on 12/06/17 please resend    Has the patient contacted their pharmacy yes    Agent: If yes, when and what did the pharmacy advise pharmacy said they never recevied the rx sent in on 12/06/17   Preferred Pharmacy   [facility]  Agent: Please be advised that RX refills may take up to 3 business days. We ask that you follow-up with your pharmacy.

## 2018-01-09 NOTE — Telephone Encounter (Signed)
Attempt to call patient to verify that pharmacy is correct- left message. Rx resent to pharmacy- it electronically verified it sent

## 2018-02-19 ENCOUNTER — Ambulatory Visit: Payer: 59 | Admitting: Family Medicine

## 2018-02-19 ENCOUNTER — Encounter: Payer: Self-pay | Admitting: Family Medicine

## 2018-02-19 VITALS — BP 122/62 | HR 58 | Temp 98.1°F | Ht 75.0 in | Wt 244.4 lb

## 2018-02-19 DIAGNOSIS — J329 Chronic sinusitis, unspecified: Secondary | ICD-10-CM

## 2018-02-19 DIAGNOSIS — B9689 Other specified bacterial agents as the cause of diseases classified elsewhere: Secondary | ICD-10-CM | POA: Diagnosis not present

## 2018-02-19 MED ORDER — AMOXICILLIN-POT CLAVULANATE 875-125 MG PO TABS: 1.0000 | ORAL_TABLET | Freq: Two times a day (BID) | ORAL | 0 refills | Status: AC

## 2018-02-19 NOTE — Patient Instructions (Addendum)
Sinus infection/Sinusitis Bacterial based on: Symptoms >10 days, double sickening  Treatment: -considered steroid: we opted out for now- will reconsider if not improving adequately on augmentin -other symptomatic care with mucinex -Antibiotic indicated: yes- if you do not do well with the augmentin and get a rash I will send in doxycycline as an alternate  Finally, we reviewed reasons to return to care including if symptoms worsen or persist or new concerns arise (particularly fever or shortness of breath)  Meds ordered this encounter  Medications  . amoxicillin-clavulanate (AUGMENTIN) 875-125 MG tablet    Sig: Take 1 tablet by mouth 2 (two) times daily for 7 days.    Dispense:  14 tablet    Refill:  0

## 2018-02-19 NOTE — Progress Notes (Signed)
PCP: Marin Olp, MD  Subjective:  Bradley Cole. is a 63 y.o. 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 History of adenomatous polyp of colon 02/01/2017    Priority: Medium  . Essential hypertension 12/30/2007    Priority: Medium  . HYPERLIPIDEMIA 11/19/2006    Priority: Medium  . Tobacco use disorder 07/09/2015    Priority: Low  . MVP (mitral valve prolapse) 07/08/2015    Priority: Low  . DDD (degenerative disc disease)     Priority: Low  . Hx of gout 12/30/2007    Priority: Low  . Meniscal injury, left, initial encounter   . History of skin cancer     Medications- reviewed  Current Outpatient Medications  Medication Sig Dispense Refill  . amLODipine (NORVASC) 2.5 MG tablet Take 1 tablet (2.5 mg total) by mouth daily. 30 tablet 11  . benazepril (LOTENSIN) 40 MG tablet Take 1 tablet (40 mg total) by mouth daily. 30 tablet 11  . metroNIDAZOLE (METROCREAM) 0.75 % cream APP EXT AA ON FACE BID  11   . Multiple Vitamins-Minerals (MEGA MULTI MEN PO) Take by mouth daily.     . rosuvastatin (CRESTOR) 20 MG tablet take 1 tablet by mouth MONDAY WEDNESDAY AND FRIDAY 36 tablet 3  . TOPROL XL 50 MG 24 hr tablet Take with or immediately following a meal. 30 tablet 11   No current facility-administered medications for this visit.     Objective: BP 122/62 (BP Location: Left Arm, Patient Position: Sitting, Cuff Size: Large)   Pulse (!) 58   Temp 98.1 F (36.7 C) (Oral)   Ht 6\' 3"  (1.905 m)   Wt 244 lb 6.4 oz (110.9 kg)   SpO2 98%   BMI 30.55 kg/m  Gen: NAD, resting comfortably HEENT: Turbinates erythematous with clear drainage, TM normal, pharynx mildly erythematous with no tonsilar exudate or edema, frontal sinus tenderness CV: RRR no murmurs rubs or gallops Lungs: CTAB no crackles, wheeze, rhonchi Ext: no edema Skin: warm, dry, no rash  Assessment/Plan:  Sinus infection/Sinusitis Bacterial based on: Symptoms >10 days, double sickening  Treatment: -considered steroid: we opted out for now- will reconsider if not improving adequately on augmentin -other symptomatic care with mucinex -Antibiotic indicated: yes- if you do not do well with the Augmentin and get a rash I will send in doxycycline as an alternate  Finally, we reviewed reasons to return to care including if symptoms worsen or persist or new concerns arise (particularly fever or shortness of breath)  Meds ordered this  encounter  Medications  . amoxicillin-clavulanate (AUGMENTIN) 875-125 MG tablet    Sig: Take 1 tablet by mouth 2 (two) times daily for 7 days.    Dispense:  14 tablet    Refill:  0  Since his symptoms never resolved and later worsened-considering this established worsening problem  Garret Reddish, MD

## 2018-03-03 ENCOUNTER — Encounter: Payer: Self-pay | Admitting: Family Medicine

## 2018-03-03 ENCOUNTER — Telehealth: Payer: Self-pay | Admitting: Family Medicine

## 2018-03-03 ENCOUNTER — Ambulatory Visit: Payer: 59 | Admitting: Family Medicine

## 2018-03-03 VITALS — BP 146/76 | HR 68 | Temp 97.7°F | Ht 75.0 in | Wt 248.0 lb

## 2018-03-03 DIAGNOSIS — R21 Rash and other nonspecific skin eruption: Secondary | ICD-10-CM

## 2018-03-03 MED ORDER — PREDNISONE 20 MG PO TABS: ORAL_TABLET | ORAL | 0 refills | Status: DC

## 2018-03-03 MED ORDER — METHYLPREDNISOLONE ACETATE 80 MG/ML IJ SUSP
80.0000 mg | Freq: Once | INTRAMUSCULAR | Status: AC
  Administered 2018-03-03: 80 mg via INTRAMUSCULAR

## 2018-03-03 NOTE — Telephone Encounter (Signed)
See note  Copied from [place] [id number]. Topic: General - Other >> Mar 03, 2018  8:20 AM Carolyn Stare wrote:  Pt  called and said he was given amox-clav 875 mg and on the 6th day he borke out in a rash and an itch. He said he thinks he has an allergic reaction   Pharmacy [facility] [place] [address]

## 2018-03-03 NOTE — Progress Notes (Signed)
Phone [phone]   Subjective:  Bradley Cole. is a 63 y.o. year old very pleasant male patient who presents for/with See problem oriented charting ROS- See problem oriented charting below  Past Medical History-  Patient Active Problem List   Diagnosis Date Noted  . History of adenomatous polyp of colon 02/01/2017    Priority: Medium  . Essential hypertension 12/30/2007    Priority: Medium  . HYPERLIPIDEMIA 11/19/2006    Priority: Medium  . Tobacco use disorder 07/09/2015    Priority: Low  . MVP (mitral valve prolapse) 07/08/2015    Priority: Low  . DDD (degenerative disc disease)     Priority: Low  . Hx of gout 12/30/2007    Priority: Low  . Meniscal injury, left, initial encounter   . History of skin cancer     Medications- reviewed and updated Current Outpatient Medications  Medication Sig Dispense Refill  . amLODipine (NORVASC) 2.5 MG tablet Take 1 tablet (2.5 mg total) by mouth daily. 30 tablet 11  . benazepril (LOTENSIN) 40 MG tablet Take 1 tablet (40 mg total) by mouth daily. 30 tablet 11  . metroNIDAZOLE (METROCREAM) 0.75 % cream APP EXT AA ON FACE BID  11  . Multiple Vitamins-Minerals (MEGA MULTI MEN PO) Take by mouth daily.     . rosuvastatin (CRESTOR) 20 MG tablet take 1 tablet by mouth [place] (Patient taking differently: Take 20 mg by mouth 2 (two) times a week. take 1 tablet by mouth MONDAY WEDNESDAY AND FRIDAY) 36 tablet 3  . TOPROL XL 50 MG 24 hr tablet Take with or immediately following a meal. 30 tablet 11     Objective:  BP (!) 146/76   Pulse 68   Temp 97.7 F (36.5 C) (Oral)   Ht 6\' 3"  (1.905 m)   Wt 248 lb (112.5 kg)   SpO2 97%   BMI 31.00 kg/m  Gen: NAD, resting comfortably No lip or tongue swelling-no rash inside the mouth or on the lips CV: RRR no murmurs rubs or gallops Lungs: CTAB no crackles, wheeze, rhonchi Ext: no edema Skin: warm, dry, multiple macules and papules throughout upper and lower extremities  bilaterally and onto trunk as well as onto face   Assessment and Plan  Rash S: Patient was recently treated for sinusitis with Augmentin.  We knew patient had a rash to penicillin when he was much younger but we opted to trial Augmentin to see if he tolerated it.  Fortunately this cleared his sinus infection but after 6 days of medication he noted a rash on his arms-this then progressed throughout bilateral upper and lower extremities as well as being on his trunk.  Now being a few days out from medication he continues to have an intensely pruritic rash in these areas- some mild improvement in his chest over the last day.  He has been taking Benadryl every 4 hours and feels fatigued from this.  He has no other systemic symptoms outside of the fatigue from the Benadryl. ROS-not ill appearing, no fever/chills. No new medications outside of Augmentin. Not immunocompromised. No mucus membrane involvement.  A/P: This appears to be a drug rash related to Augmentin-fortunately sinusitis has cleared and we will have to use doxycycline at this time.  We will trial Depo-Medrol 80 mg IM.  We will also put him on a prednisone taper.  Patient states he usually gets some stomach tenderness within 2 or 3 days of prednisone but he wants to take  this as long as he can-I used a lower dose over a 14-day period.  We discussed return precautions-particularly if he gets a rash on the inside of his mouth and the potential risk of Stevens-Johnson though I do not think this represents Stevens-Johnson  Patient's blood pressure was normal last visit-note mild elevation today.  I suspect this is from intense pruritus and irritation and I actually suspect symptoms will get better on prednisone instead of worse.  There is some risk for blood pressure elevation on prednisone of course.  Lab/Order associations: Rash - Plan: methylPREDNISolone acetate (DEPO-MEDROL) injection 80 mg  Meds ordered this encounter  Medications  .  methylPREDNISolone acetate (DEPO-MEDROL) injection 80 mg  . predniSONE (DELTASONE) 20 MG tablet    Sig: Take 1 pill for 10 days, 1/2 pill for 4 more days    Dispense:  12 tablet    Refill:  0   Return precautions advised.  Garret Reddish, MD

## 2018-03-03 NOTE — Patient Instructions (Addendum)
We have learned a valuable lesson-no more Augmentin or amoxicillin for you!  Steroid injection given today.  We will also try prednisone for up to 14 days or as long as you can tolerate it to try to prevent any rebound rash.  I am trying a lower dose to see if your stomach can tolerate it for longer.  If you feel like you are starting to do poorly on it by day 3 or 4-go ahead and try half tablet instead of a full tablet.  Certainly let us know if anything worsens despite treatment-usually prednisone takes about 24 hours.  We would want to know if there is any rash on the lips or in the mouth

## 2018-03-03 NOTE — Telephone Encounter (Signed)
Called and spoke with patient who states he stopped he medication last Thursday or Friday. He broke out in a rash on day 6 of taking the medication. He states he has been taking Benadryl every 4 hours and feels that is keeping it from getting worse. He feels like the rash peaked yesterday.   I instructed patient to come in and be evaluated by Dr. Yong Channel as he feels like he needs a steroid injection to help calm down the rash. He does state he is not having trouble breathing.

## 2018-03-03 NOTE — Telephone Encounter (Signed)
Per OV on 02/19/2018:  Sinus infection/Sinusitis Bacterial based on: Symptoms >10 days, double sickening  Treatment: -considered steroid: we opted out for now- will reconsider if not improving adequately on augmentin -other symptomatic care with mucinex -Antibiotic indicated: yes- if you do not do well with the Augmentin and get a rash I will send in doxycycline as an alternate  Finally, we reviewed reasons to return to care including if symptoms worsen or persist or new concerns arise (particularly fever or shortness of breath)

## 2018-11-14 ENCOUNTER — Telehealth: Payer: Self-pay

## 2018-11-14 ENCOUNTER — Other Ambulatory Visit: Payer: Self-pay

## 2018-11-14 MED ORDER — ROSUVASTATIN CALCIUM 20 MG PO TABS: 20.0000 mg | ORAL_TABLET | ORAL | 3 refills | Status: DC

## 2018-11-14 NOTE — Telephone Encounter (Signed)
Returned call to pharmacy.

## 2018-11-14 NOTE — Telephone Encounter (Signed)
Copied from [place] [phone]. Topic: General - Inquiry >> Nov 14, 2018  2:27 PM Mathis Bud wrote: Reason for CRM: Randall Hiss from [facility] called stating medication rosuvastatin (CRESTOR) 20 MG tablet the usage needs to be clarified.  Call back [phone]

## 2018-12-03 ENCOUNTER — Encounter: Payer: Self-pay | Admitting: Family Medicine

## 2018-12-22 ENCOUNTER — Other Ambulatory Visit: Payer: Self-pay | Admitting: Family Medicine

## 2018-12-22 DIAGNOSIS — I10 Essential (primary) hypertension: Secondary | ICD-10-CM

## 2018-12-24 ENCOUNTER — Other Ambulatory Visit: Payer: Self-pay

## 2018-12-24 MED ORDER — BENAZEPRIL HCL 40 MG PO TABS: 40.0000 mg | ORAL_TABLET | Freq: Every day | ORAL | 11 refills | Status: DC

## 2018-12-24 MED ORDER — AMLODIPINE BESYLATE 2.5 MG PO TABS: 2.5000 mg | ORAL_TABLET | Freq: Every day | ORAL | 11 refills | Status: DC

## 2019-02-17 ENCOUNTER — Other Ambulatory Visit: Payer: Self-pay

## 2019-05-20 ENCOUNTER — Ambulatory Visit: Payer: 59 | Attending: Internal Medicine

## 2019-05-20 DIAGNOSIS — Z20822 Contact with and (suspected) exposure to covid-19: Secondary | ICD-10-CM

## 2019-05-21 LAB — NOVEL CORONAVIRUS, NAA: SARS-CoV-2, NAA: NOT DETECTED

## 2019-05-21 LAB — SARS-COV-2, NAA 2 DAY TAT

## 2019-05-25 ENCOUNTER — Other Ambulatory Visit: Payer: 59

## 2019-05-27 ENCOUNTER — Ambulatory Visit: Payer: 59 | Attending: Internal Medicine

## 2019-05-27 DIAGNOSIS — Z20822 Contact with and (suspected) exposure to covid-19: Secondary | ICD-10-CM

## 2019-05-29 LAB — SARS-COV-2, NAA 2 DAY TAT

## 2019-05-29 LAB — NOVEL CORONAVIRUS, NAA: SARS-CoV-2, NAA: NOT DETECTED

## 2019-10-28 ENCOUNTER — Telehealth: Payer: Self-pay | Admitting: Family Medicine

## 2019-10-28 ENCOUNTER — Encounter: Payer: Self-pay | Admitting: Family Medicine

## 2019-10-28 NOTE — Telephone Encounter (Signed)
Patient is requesting to get lab work done prior to physical appointment , to be able to discuss at CPE appointment  Please advise

## 2019-10-28 NOTE — Telephone Encounter (Signed)
Ok to schedule 2-3 days prior to CPE.

## 2019-12-16 ENCOUNTER — Encounter: Payer: Self-pay | Admitting: Family Medicine

## 2019-12-24 ENCOUNTER — Telehealth: Payer: Self-pay

## 2019-12-24 ENCOUNTER — Encounter: Payer: Self-pay | Admitting: Family Medicine

## 2019-12-24 DIAGNOSIS — I10 Essential (primary) hypertension: Secondary | ICD-10-CM

## 2019-12-24 MED ORDER — TOPROL XL 50 MG PO TB24: ORAL_TABLET | ORAL | 0 refills | Status: DC

## 2019-12-24 NOTE — Telephone Encounter (Signed)
  LAST APPOINTMENT DATE: 10/28/2019   NEXT APPOINTMENT DATE:@1 /25/2022  MEDICATION: TOPROL XL 50 MG 24 hr tablet  PHARMACY: [id number] - [place], [place] - [address] AT [address] is going out of town, states [facility] has faxed over a refill request on Tuesday with no response.   Please advise

## 2019-12-24 NOTE — Telephone Encounter (Signed)
#  [phone] sent in to get pt to his CPE in 02/2020.

## 2019-12-28 ENCOUNTER — Other Ambulatory Visit: Payer: Self-pay | Admitting: Family Medicine

## 2019-12-28 ENCOUNTER — Encounter: Payer: Self-pay | Admitting: Family Medicine

## 2020-01-11 ENCOUNTER — Encounter: Payer: Self-pay | Admitting: Family Medicine

## 2020-01-19 ENCOUNTER — Other Ambulatory Visit: Payer: Self-pay | Admitting: Family Medicine

## 2020-01-19 DIAGNOSIS — I10 Essential (primary) hypertension: Secondary | ICD-10-CM

## 2020-02-08 ENCOUNTER — Encounter: Payer: Self-pay | Admitting: Family Medicine

## 2020-02-15 ENCOUNTER — Other Ambulatory Visit: Payer: Self-pay

## 2020-02-15 ENCOUNTER — Encounter: Payer: Self-pay | Admitting: Family Medicine

## 2020-02-15 DIAGNOSIS — I10 Essential (primary) hypertension: Secondary | ICD-10-CM

## 2020-02-15 MED ORDER — TOPROL XL 50 MG PO TB24: ORAL_TABLET | ORAL | 2 refills | Status: DC

## 2020-02-16 ENCOUNTER — Other Ambulatory Visit: Payer: Self-pay | Admitting: Family Medicine

## 2020-02-16 DIAGNOSIS — I10 Essential (primary) hypertension: Secondary | ICD-10-CM

## 2020-03-01 ENCOUNTER — Other Ambulatory Visit: Payer: Self-pay | Admitting: Family Medicine

## 2020-03-01 ENCOUNTER — Other Ambulatory Visit: Payer: Self-pay

## 2020-03-01 ENCOUNTER — Other Ambulatory Visit (INDEPENDENT_AMBULATORY_CARE_PROVIDER_SITE_OTHER): Payer: 59

## 2020-03-01 DIAGNOSIS — I10 Essential (primary) hypertension: Secondary | ICD-10-CM

## 2020-03-01 DIAGNOSIS — E782 Mixed hyperlipidemia: Secondary | ICD-10-CM

## 2020-03-01 DIAGNOSIS — Z0000 Encounter for general adult medical examination without abnormal findings: Secondary | ICD-10-CM

## 2020-03-01 DIAGNOSIS — Z125 Encounter for screening for malignant neoplasm of prostate: Secondary | ICD-10-CM

## 2020-03-01 LAB — COMPREHENSIVE METABOLIC PANEL
ALT: 24 U/L (ref 0–53)
AST: 18 U/L (ref 0–37)
Albumin: 4.5 g/dL (ref 3.5–5.2)
Alkaline Phosphatase: 75 U/L (ref 39–117)
BUN: 14 mg/dL (ref 6–23)
CO2: 29 mEq/L (ref 19–32)
Calcium: 9.5 mg/dL (ref 8.4–10.5)
Chloride: 102 mEq/L (ref 96–112)
Creatinine, Ser: 0.94 mg/dL (ref 0.40–1.50)
GFR: 85.37 mL/min (ref >60.00)
Glucose, Bld: 86 mg/dL (ref 70–99)
Potassium: 4.2 mEq/L (ref 3.5–5.1)
Sodium: 138 mEq/L (ref 135–145)
Total Bilirubin: 0.7 mg/dL (ref 0.2–1.2)
Total Protein: 7.2 g/dL (ref 6.0–8.3)

## 2020-03-01 LAB — LIPID PANEL
Cholesterol: 188 mg/dL (ref 0–200)
HDL: 44.3 mg/dL (ref >39.00)
NonHDL: 143.62
Total CHOL/HDL Ratio: 4
Triglycerides: 222 mg/dL — ABNORMAL HIGH (ref 0.0–149.0)
VLDL: 44.4 mg/dL — ABNORMAL HIGH (ref 0.0–40.0)

## 2020-03-01 LAB — CBC WITH DIFFERENTIAL/PLATELET
Basophils Absolute: 0 10*3/uL (ref 0.0–0.1)
Basophils Relative: 0.3 % (ref 0.0–3.0)
Eosinophils Absolute: 0.1 10*3/uL (ref 0.0–0.7)
Eosinophils Relative: 2.2 % (ref 0.0–5.0)
HCT: 41.3 % (ref 39.0–52.0)
Hemoglobin: 14 g/dL (ref 13.0–17.0)
Lymphocytes Relative: 30 % (ref 12.0–46.0)
Lymphs Abs: 1.8 10*3/uL (ref 0.7–4.0)
MCHC: 33.9 g/dL (ref 30.0–36.0)
MCV: 87.9 fl (ref 78.0–100.0)
Monocytes Absolute: 0.5 10*3/uL (ref 0.1–1.0)
Monocytes Relative: 7.6 % (ref 3.0–12.0)
Neutro Abs: 3.6 10*3/uL (ref 1.4–7.7)
Neutrophils Relative %: 59.9 % (ref 43.0–77.0)
Platelets: 278 10*3/uL (ref 150.0–400.0)
RBC: 4.7 Mil/uL (ref 4.22–5.81)
RDW: 13.6 % (ref 11.5–15.5)
WBC: 6.1 10*3/uL (ref 4.0–10.5)

## 2020-03-01 LAB — VITAMIN D 25 HYDROXY (VIT D DEFICIENCY, FRACTURES): VITD: 43.25 ng/mL (ref 30.00–100.00)

## 2020-03-01 LAB — TSH: TSH: 1.61 u[IU]/mL (ref 0.35–4.50)

## 2020-03-01 LAB — PSA: PSA: 1.06 ng/mL (ref 0.10–4.00)

## 2020-03-01 LAB — LDL CHOLESTEROL, DIRECT: Direct LDL: 112 mg/dL

## 2020-03-03 ENCOUNTER — Other Ambulatory Visit: Payer: Self-pay

## 2020-03-03 ENCOUNTER — Encounter: Payer: Self-pay | Admitting: Family Medicine

## 2020-03-03 ENCOUNTER — Ambulatory Visit (INDEPENDENT_AMBULATORY_CARE_PROVIDER_SITE_OTHER): Payer: 59 | Admitting: Family Medicine

## 2020-03-03 VITALS — BP 144/90 | HR 65 | Temp 97.9°F | Ht 75.0 in | Wt 245.0 lb

## 2020-03-03 DIAGNOSIS — Z1159 Encounter for screening for other viral diseases: Secondary | ICD-10-CM | POA: Diagnosis not present

## 2020-03-03 DIAGNOSIS — I10 Essential (primary) hypertension: Secondary | ICD-10-CM

## 2020-03-03 DIAGNOSIS — E782 Mixed hyperlipidemia: Secondary | ICD-10-CM

## 2020-03-03 DIAGNOSIS — Z0000 Encounter for general adult medical examination without abnormal findings: Secondary | ICD-10-CM

## 2020-03-03 DIAGNOSIS — Z87891 Personal history of nicotine dependence: Secondary | ICD-10-CM | POA: Diagnosis not present

## 2020-03-03 DIAGNOSIS — Z114 Encounter for screening for human immunodeficiency virus [HIV]: Secondary | ICD-10-CM

## 2020-03-03 LAB — POC URINALSYSI DIPSTICK (AUTOMATED)
Bilirubin, UA: NEGATIVE
Blood, UA: NEGATIVE
Glucose, UA: NEGATIVE
Ketones, UA: NEGATIVE
Leukocytes, UA: NEGATIVE
Nitrite, UA: NEGATIVE
Protein, UA: NEGATIVE
Spec Grav, UA: >=1.03 — AB (ref 1.010–1.025)
Urobilinogen, UA: 0.2 E.U./dL
pH, UA: 5.5 (ref 5.0–8.0)

## 2020-03-03 MED ORDER — ROSUVASTATIN CALCIUM 20 MG PO TABS: ORAL_TABLET | ORAL | 3 refills | Status: DC

## 2020-03-03 NOTE — Addendum Note (Signed)
Addended by: Adah Salvage F on: 03/03/2020 03:50 PM   Modules accepted: Orders

## 2020-03-03 NOTE — Progress Notes (Signed)
Phone: [phone]   Subjective:  Patient presents today for their annual physical. Chief complaint-noted.   See problem oriented charting- ROS- full  review of systems was completed and negative  except for: right knee pain  The following were reviewed and entered/updated in epic: Past Medical History:  Diagnosis Date  . DDD (degenerative disc disease)   . Degenerative disc disease, lumbar   . Degenerative disc disease, lumbar   . Heart murmur   . History of skin cancer    nose- with Dr. Ubaldo Glassing   . Hyperlipidemia    LDL goal < 100, ideally < 70  . Hypertension   . Meniscal injury, left, initial encounter    [facility] following- has a flap. will wait on surgery until cannot bear it.    Patient Active Problem List   Diagnosis Date Noted  . History of adenomatous polyp of colon 02/01/2017    Priority: Medium  . Essential hypertension 12/30/2007    Priority: Medium  . HYPERLIPIDEMIA 11/19/2006    Priority: Medium  . Tobacco use disorder 07/09/2015    Priority: Low  . MVP (mitral valve prolapse) 07/08/2015    Priority: Low  . DDD (degenerative disc disease)     Priority: Low  . Hx of gout 12/30/2007    Priority: Low  . Meniscal injury, left, initial encounter   . History of skin cancer    Past Surgical History:  Procedure Laterality Date  . COLONOSCOPY  2007   negative,  GI  . KNEE SURGERY     R knee torn ligament  . POLYPECTOMY      Family History  Problem Relation Age of Onset  . Hypertension Father   . Mitral valve prolapse Father   . Lymphoma Father        mantel cell  . Glaucoma Father   . Macular degeneration Father   . Benign prostatic hyperplasia Father   . Diabetes Father   . Hypertension Mother   . Heart disease Mother        valvular heart disease  . Heart attack Mother        based on findings @ valve replacement  . Migraines Mother   . Heart attack Maternal Grandfather        in 62s  . Heart disease Maternal  Grandfather   . Diabetes Paternal Grandmother   . Hypertension Paternal Grandmother   . Alcohol abuse Son   . Heart disease Maternal Grandmother   . Stroke Neg Hx   . Colon cancer Neg Hx   . Stomach cancer Neg Hx   . Rectal cancer Neg Hx     Medications- reviewed and updated Current Outpatient Medications  Medication Sig Dispense Refill  . amLODipine (NORVASC) 2.5 MG tablet TAKE 1 TABLET(2.5 MG) BY MOUTH DAILY 30 tablet 11  . benazepril (LOTENSIN) 40 MG tablet TAKE 1 TABLET(40 MG) BY MOUTH DAILY 30 tablet 11  . Multiple Vitamins-Minerals (MEGA MULTI MEN PO) Take by mouth daily.    . TOPROL XL 50 MG 24 hr tablet TAKE 1 TABLET BY MOUTH ONCE DAILY WITH OR IMMEDIATELY FOLLOWING A MEAL 90 tablet 2  . rosuvastatin (CRESTOR) 20 MG tablet TAKE 1 TABLET BY MOUTH THREE TIMES A WEEK. 40 tablet 3   No current facility-administered medications for this visit.    Allergies-reviewed and updated Allergies  Allergen Reactions  . Augmentin [Amoxicillin-Pot Clavulanate] Rash    Severe rash over entire body  . Penicillins  Rash @ 14; he has had Ampicillin since w/o adverse  effect    Social History   Social History Narrative   Married 37 years in 2017. 2 children 54 and 53. 4 grandkids (1 on way included) in 2017.       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 TM normal. Bridge of nose normal. Eyelids normal.  Neck: no thyromegaly or cervical lymphadenopathy  CV: RRR no murmurs rubs or gallops Lungs: CTAB no crackles, wheeze, rhonchi Abdomen: soft/nontender/nondistended/normal bowel sounds. No rebound or  guarding.  Ext: no edema Skin: warm, dry Neuro: grossly normal, moves all extremities, PERRLA   Assessment and Plan  65 y.o. male presenting for annual physical.  Health Maintenance counseling: 1. Anticipatory guidance: Patient counseled regarding regular dental exams -q6 months, eye exams -yearly,  avoiding smoking and second hand smoke , limiting alcohol to 2 beverages per day - less than that- just on weekends.   2. Risk factor reduction:  Advised patient of need for regular exercise and diet rich and fruits and vegetables to reduce risk of heart attack and stroke. Exercise- had been exercising at home- but misses sport time- planning to start back soon as up to date on covid vaccine. Diet-trying to cut down on sweets, green or fruit for lunc.  Slight weight loss form last visit. Goal has been 220 - today sets site on 235 within a year Wt Readings from Last 3 Encounters:  03/03/20 245 lb (111.1 kg)  03/03/18 248 lb (112.5 kg)  02/19/18 244 lb 6.4 oz (110.9 kg)  3. Immunizations/screenings/ancillary studies- covid vaccine fully up to date- will call with dates or mychart [place], shingrix -declines for now, flu shot - declines. HCV screen - done with donating blood plus bought life insurance. Pneumovax 23 -declines for now Immunization History  Administered Date(s) Administered  . Tdap 03/03/2012, 07/08/2015   4. Prostate cancer screening- low risk based off psa trend.   Lab Results  Component Value Date   PSA 1.06 03/01/2020   PSA 1.18 12/10/2017   PSA 1.23 11/19/2016   5. Colon cancer screening - dec 2018 with 5 year repeat 6. Skin cancer screening- referred to Dr. 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 advised regular sunscreen use. Denies worrisome, changing, or new skin lesions.  7. former smoker- cigarfree since 2017. No dip/chew recently. Check UA. AAA screen. Below threshold for lung cancer screening 8. STD screening - only active with wife  Status of  chronic or acute concerns   #hypertension S: medication: Benazepril 40Mg , Amlodipine 2.5Mg , Toprol XL 50Mg  (chronic cough on generic) Home readings #s: not checking recently BP Readings from Last 3 Encounters:  03/03/20 (!) 144/90  03/03/18 (!) 146/76  02/19/18 122/62  A/P: blood pressure slightly high today. In office we want <140/90 at home I prefer <135/85- asked him to do some home monitoring 3x a week for 2 weeks and then update me by mychart- we may need to increase amlodipine to  5 mg if running high at home too  #hyperlipidemia with peak LDL 132 in past S: Medication: rosuvastatin 20Mg  twice a week Lab Results  Component Value Date   CHOL 188 03/01/2020   HDL 44.30 03/01/2020   LDLCALC 77 12/10/2017   LDLDIRECT 112.0 03/01/2020   TRIG 222.0 (H) 03/01/2020   CHOLHDL 4 03/01/2020   A/P: lipids above goal - need 30% reduction - will go back to 3x a week and recheck next year  #gout- organic cherry juice in past has helped with flares  # I have not heard murmur from MVP that Dr. Linna Darner previously noted  #rosacea- followed by Dr. Ubaldo Glassing- not using topial right now  #right knee pain- recommended voltaren gel- may need surgery  Recommended follow up: Return in about 6 months (around 08/31/2020) for follow up- or sooner if needed.  Lab/Order associations: already had labs   ICD-10-CM   1. Preventative health care  Z00.00   2. Essential hypertension  I10   3. HYPERLIPIDEMIA  E78.2   4. Encounter for hepatitis C screening test for low risk patient  Z11.59   5. Screening for HIV without presence of risk factors  Z11.4   6. Former smoker  Z87.891 POCT Urinalysis Dipstick (Automated)    US AORTA MEDICARE SCREENING    CANCELED: US AORTA MEDICARE SCREENING   Meds ordered this encounter  Medications  . rosuvastatin (CRESTOR) 20 MG tablet    Sig: TAKE 1 TABLET BY MOUTH THREE TIMES A WEEK.    Dispense:  40 tablet    Refill:  3    Return precautions advised.  Garret Reddish,  MD

## 2020-03-03 NOTE — Patient Instructions (Addendum)
Please stop by lab before you go- just urine If you have mychart- we will send your results within 3 business days of [place] receiving them.  If you do not have mychart- we will call you about results within 5 business days of [place] receiving them.  *please also note that you will see labs on mychart as soon as they post. I will later go in and write notes on them- will say "notes from Dr. Yong Channel"  We will call you within two weeks about your referral to aneurysm screening of abdomen. If you do not hear within 2 weeks, give [place] a call.   blood pressure slightly high today. In office we want <140/90 at home I prefer <135/85- asked him to do some home monitoring at least 3x a week for 2 weeks and then update me by mychart- we may need to increase amlodipine to 5 mg if running high at home too  Reach out to [place] with dates of covid vaccine - [employer]- on [place].   Try voltaren gel for the right knee- can use up to 4x a day

## 2020-03-04 ENCOUNTER — Telehealth: Payer: Self-pay

## 2020-03-04 NOTE — Telephone Encounter (Signed)
contacted the pt to make him aware of his [place] at [facility]-I left a message with instructions and to call us back if he has any questions

## 2020-03-07 ENCOUNTER — Encounter: Payer: Self-pay | Admitting: Family Medicine

## 2020-03-11 ENCOUNTER — Ambulatory Visit (HOSPITAL_COMMUNITY): Admission: RE | Admit: 2020-03-11 | Payer: 59 | Source: Ambulatory Visit

## 2020-04-12 ENCOUNTER — Encounter: Payer: Self-pay | Admitting: Family Medicine

## 2020-04-12 ENCOUNTER — Telehealth (INDEPENDENT_AMBULATORY_CARE_PROVIDER_SITE_OTHER): Payer: 59 | Admitting: Family Medicine

## 2020-04-12 ENCOUNTER — Other Ambulatory Visit: Payer: 59

## 2020-04-12 DIAGNOSIS — R197 Diarrhea, unspecified: Secondary | ICD-10-CM

## 2020-04-12 DIAGNOSIS — R0982 Postnasal drip: Secondary | ICD-10-CM | POA: Diagnosis not present

## 2020-04-12 DIAGNOSIS — R059 Cough, unspecified: Secondary | ICD-10-CM

## 2020-04-12 DIAGNOSIS — Z20822 Contact with and (suspected) exposure to covid-19: Secondary | ICD-10-CM

## 2020-04-12 DIAGNOSIS — J111 Influenza due to unidentified influenza virus with other respiratory manifestations: Secondary | ICD-10-CM | POA: Diagnosis not present

## 2020-04-12 MED ORDER — BENZONATATE 100 MG PO CAPS: 100.0000 mg | ORAL_CAPSULE | Freq: Three times a day (TID) | ORAL | 0 refills | Status: DC | PRN

## 2020-04-12 NOTE — Telephone Encounter (Signed)
Called patient and schedule him a virtual with Dr. Maudie Mercury and a PCR test at [facility] for 5:15pm.

## 2020-04-12 NOTE — Patient Instructions (Signed)
  HOME CARE TIPS:  -[place] COVID19 testing information: https://www.[facility]a-powers.org/ OR [phone] Most pharmacies also offer testing and home test kits. -if you have a positive test, please schedule a follow up video visit through Dr. Ansel Bong office or through [place].  -I sent the medication(s) we discussed to your pharmacy: Meds ordered this encounter  Medications  . benzonatate (TESSALON PERLES) 100 MG capsule    Sig: Take 1 capsule (100 mg total) by mouth 3 (three) times daily as needed.    Dispense:  20 capsule    Refill:  0    -can use tylenol  if needed for fevers, aches and pains per instructions  -can use nasal saline a few times per day if you have nasal congestion; sometimes  a short course of Afrin nasal spray for 3 days can help with symptoms as well  -stay hydrated, drink plenty of fluids and eat small healthy meals - avoid dairy  -can take 1000 IU (32mcg) Vit D3 and 100-500 mg of Vit C daily per instructions  -If the Covid test is positive, check out the CDC website for more information on home care, transmission and treatment for COVID19  -follow up with your doctor in 2-3 days unless improving and feeling better  -stay home while sick, except to seek medical care, and if you have [place] ideally it would be best to stay home for a full 10 days since the onset of symptoms PLUS one day of no fever and feeling better. Wear a good mask (such as N95 or KN95) if around others to reduce the risk of transmission.  It was nice to meet you today, and I really hope you are feeling better soon. I help [place] out with telemedicine visits on Tuesdays and Thursdays and am available for visits on those days. If you have any concerns or questions following this visit please schedule a follow up visit with your Primary Care doctor or seek care at a local urgent care clinic to avoid delays in care.    Seek in person care or schedule a follow  up video visit promptly if your symptoms worsen, new concerns arise or you are not improving with treatment. Call 911 and/or seek emergency care if your symptoms are severe or life threatening.

## 2020-04-12 NOTE — Progress Notes (Signed)
Virtual Visit via Video Note  I connected with Bradley Cole  on 04/12/20 at  3:20 PM EST by a video enabled telemedicine application and verified that I am speaking with the correct person using two identifiers.  Location patient: home, [place] Location provider:work or home office Persons participating in the virtual visit: patient, provider, wife  I discussed the limitations of evaluation and management by telemedicine and the availability of in person appointments. The patient expressed understanding and agreed to proceed.   HPI:  Acute telemedicine visit for Cough and congestion: -Onset: 3-4 days ago -Symptoms include: diarrhea initially, nausea (for 6-7 hours), the first 2 days  -"felt like hit by a truck", subjective fever, then chills (was unsure if had a fever), nasal congestion, cough, pnd, sore throat -covid test was negative on day 2, is doing another covid test this evening -Denies: CP, SOB, fever today, persistent NVD, inability to eat/drink/get out of bed, known sick contacts -Has tried: flonase, tylenol -Pertinent past medical history:HTN,  -Pertinent medication allergies:penicillin, amoxicillin -COVID-19 vaccine status: had covid shots x 3; has not had flu shot  ROS: See pertinent positives and negatives per HPI.  Past Medical History:  Diagnosis Date   DDD (degenerative disc disease)    Degenerative disc disease, lumbar    Degenerative disc disease, lumbar    Heart murmur    History of skin cancer    nose- with Dr. Ubaldo Glassing    Hyperlipidemia    LDL goal < 100, ideally < 70   Hypertension    Meniscal injury, left, initial encounter    [facility] following- has a flap. will wait on surgery until cannot bear it.     Past Surgical History:  Procedure Laterality Date   COLONOSCOPY  2007   negative, [facility]   KNEE SURGERY     R knee torn ligament   POLYPECTOMY       Current Outpatient Medications:    benzonatate (TESSALON PERLES) 100 MG  capsule, Take 1 capsule (100 mg total) by mouth 3 (three) times daily as needed., Disp: 20 capsule, Rfl: 0   amLODipine (NORVASC) 2.5 MG tablet, TAKE 1 TABLET(2.5 MG) BY MOUTH DAILY, Disp: 30 tablet, Rfl: 11   benazepril (LOTENSIN) 40 MG tablet, TAKE 1 TABLET(40 MG) BY MOUTH DAILY, Disp: 30 tablet, Rfl: 11   Multiple Vitamins-Minerals (MEGA MULTI MEN PO), Take by mouth daily., Disp: , Rfl:    rosuvastatin (CRESTOR) 20 MG tablet, TAKE 1 TABLET BY MOUTH THREE TIMES A WEEK., Disp: 40 tablet, Rfl: 3   TOPROL XL 50 MG 24 hr tablet, TAKE 1 TABLET BY MOUTH ONCE DAILY WITH OR IMMEDIATELY FOLLOWING A MEAL, Disp: 90 tablet, Rfl: 2  EXAM:  VITALS per patient if applicable:  GENERAL: alert, oriented, appears well and in no acute distress  HEENT: atraumatic, conjunttiva clear, no obvious abnormalities on inspection of external nose and ears  NECK: normal movements of the head and neck  LUNGS: on inspection no signs of respiratory distress, breathing rate appears normal, no obvious gross SOB, gasping or wheezing  CV: no obvious cyanosis  MS: moves all visible extremities without noticeable abnormality  PSYCH/NEURO: pleasant and cooperative, no obvious depression or anxiety, speech and thought processing grossly intact  ASSESSMENT AND PLAN:  Discussed the following assessment and plan:  Cough  PND (post-nasal drip)  Diarrhea, unspecified type  Influenza-like illness  -we discussed possible serious and likely etiologies, options for evaluation and workup, limitations of telemedicine visit vs in person visit,  treatment, treatment risks and precautions. Pt prefers to treat via telemedicine empirically rather than in person at this moment.  Query influenza, possible Covid, possible viral infection versus other.  Overall, he seems to be doing better at this point with resolution of the diarrhea, chills and subjective fevers. Still has some nasal congestion, postnasal drip and cough.  Opted to  treat with nasal saline, Tessalon for cough and other symptomatic care measures summarized in patient instructions.  Did discuss risk within Nasal Decongestant and advised limited use for only maximum of 3 days.  Also advised he repeat the covid test as planned and schedule prompt follow up video visit with PCP office or [place] if he gets a positive test within the treatment window.  Advised to stay home while sick and per guidelines if Covid test is positive. Scheduled follow up with PCP offered: Agrees to schedule follow-up if needed Advised to seek prompt follow-up or in person care if worsening, new symptoms arise, or if is not improving with treatment. Did let this patient know that I only do telemedicine on Tuesdays and Thursdays for [place]. Advised to schedule follow up visit with PCP or [facility] if any further questions or concerns to avoid delays in care.   I discussed the assessment and treatment plan with the patient. The patient was provided an opportunity to ask questions and all were answered. The patient agreed with the plan and demonstrated an understanding of the instructions.     Lucretia Kern, DO

## 2020-04-13 LAB — NOVEL CORONAVIRUS, NAA: SARS-CoV-2, NAA: NOT DETECTED

## 2020-04-13 LAB — SARS-COV-2, NAA 2 DAY TAT

## 2020-05-30 ENCOUNTER — Encounter: Payer: Self-pay | Admitting: Family Medicine

## 2020-05-31 ENCOUNTER — Telehealth (INDEPENDENT_AMBULATORY_CARE_PROVIDER_SITE_OTHER): Payer: 59 | Admitting: Family Medicine

## 2020-05-31 ENCOUNTER — Ambulatory Visit: Payer: 59 | Admitting: Family Medicine

## 2020-05-31 DIAGNOSIS — J329 Chronic sinusitis, unspecified: Secondary | ICD-10-CM | POA: Diagnosis not present

## 2020-05-31 MED ORDER — AZITHROMYCIN 250 MG PO TABS: ORAL_TABLET | ORAL | 0 refills | Status: DC

## 2020-05-31 NOTE — Progress Notes (Signed)
   Bradley Cole. is a 65 y.o. male who presents today for a virtual office visit.  Assessment/Plan:  New/Acute Problems: Sinusitis No red flags.  Will start azithromycin given length of symptoms.  He has done well with this in the past.  Has penicillin allergy and would prefer azithromycin over doxycycline.  Discussed reasons to return to care.  Follow-up as needed.     Subjective:  HPI:  Patient with cough and drainage for the last several weeks. Associated symptoms include diarrhea a few weeks that has since resolved. Covid test was negative.  No fevers.  No chills.  Has a lot of sinus pressure.       Objective/Observations  Physical Exam: Gen: NAD, resting comfortably Pulm: Normal work of breathing Neuro: Grossly normal, moves all extremities Psych: Normal affect and thought content  Virtual Visit via Video   I connected with Bradley Cole. on 05/31/20 at  9:40 AM EDT by a video enabled telemedicine application and verified that I am speaking with the correct person using two identifiers. The limitations of evaluation and management by telemedicine and the availability of in person appointments were discussed. The patient expressed understanding and agreed to proceed.   Patient location: Home Provider location: [place] participating in the virtual visit: Myself and Patient     Algis Greenhouse. Jerline Pain, MD 05/31/2020 9:44 AM

## 2020-07-05 ENCOUNTER — Telehealth (INDEPENDENT_AMBULATORY_CARE_PROVIDER_SITE_OTHER): Payer: 59 | Admitting: Family Medicine

## 2020-07-05 ENCOUNTER — Encounter: Payer: Self-pay | Admitting: Family Medicine

## 2020-07-05 DIAGNOSIS — R0981 Nasal congestion: Secondary | ICD-10-CM

## 2020-07-05 NOTE — Progress Notes (Signed)
Virtual Visit via Video Note  I connected with Bradley Cole  on 07/05/20 at  6:20 PM EDT by a video enabled telemedicine application and verified that I am speaking with the correct person using two identifiers.  Location patient: home, [place] Location provider:work or home office Persons participating in the virtual visit: patient, provider, wife  I discussed the limitations of evaluation and management by telemedicine and the availability of in person appointments. The patient expressed understanding and agreed to proceed.   HPI:  Acute telemedicine visit for sinus congestion: -Onset: today -Symptoms include: mild nasal congestion on and of and some mild ear pressure intermittently from ear to eat -Denies:fevers, malaise, cough, CP, SOB or other symptoms - reports feels fine otherwise -Pertinent past medical history: see below -Pertinent medication allergies: Allergies  Allergen Reactions  . Augmentin [Amoxicillin-Pot Clavulanate] Rash    Severe rash over entire body  . Penicillins     Rash @ 14; he has had Ampicillin since w/o adverse  effect   -COVID-19 vaccine status: vaccinated and boosted  ROS: See pertinent positives and negatives per HPI.  Past Medical History:  Diagnosis Date  . DDD (degenerative disc disease)   . Degenerative disc disease, lumbar   . Degenerative disc disease, lumbar   . Heart murmur   . History of skin cancer    nose- with Dr. Ubaldo Glassing   . Hyperlipidemia    LDL goal < 100, ideally < 70  . Hypertension   . Meniscal injury, left, initial encounter    [facility] following- has a flap. will wait on surgery until cannot bear it.     Past Surgical History:  Procedure Laterality Date  . COLONOSCOPY  2007   negative, [facility]  . KNEE SURGERY     R knee torn ligament  . POLYPECTOMY       Current Outpatient Medications:  .  amLODipine (NORVASC) 2.5 MG tablet, TAKE 1 TABLET(2.5 MG) BY MOUTH DAILY, Disp: 30 tablet, Rfl: 11 .  azithromycin  (ZITHROMAX) 250 MG tablet, Take 2 tabs day 1, then 1 tab daily, Disp: 6 each, Rfl: 0 .  benazepril (LOTENSIN) 40 MG tablet, TAKE 1 TABLET(40 MG) BY MOUTH DAILY, Disp: 30 tablet, Rfl: 11 .  benzonatate (TESSALON PERLES) 100 MG capsule, Take 1 capsule (100 mg total) by mouth 3 (three) times daily as needed., Disp: 20 capsule, Rfl: 0 .  Multiple Vitamins-Minerals (MEGA MULTI MEN PO), Take by mouth daily., Disp: , Rfl:  .  rosuvastatin (CRESTOR) 20 MG tablet, TAKE 1 TABLET BY MOUTH THREE TIMES A WEEK., Disp: 40 tablet, Rfl: 3 .  TOPROL XL 50 MG 24 hr tablet, TAKE 1 TABLET BY MOUTH ONCE DAILY WITH OR IMMEDIATELY FOLLOWING A MEAL, Disp: 90 tablet, Rfl: 2  EXAM:  VITALS per patient if applicable:  GENERAL: alert, oriented, appears well and in no acute distress  HEENT: atraumatic, conjunttiva clear, no obvious abnormalities on inspection of external nose and ears  NECK: normal movements of the head and neck  LUNGS: on inspection no signs of respiratory distress, breathing rate appears normal, no obvious gross SOB, gasping or wheezing  CV: no obvious cyanosis  MS: moves all visible extremities without noticeable abnormality  PSYCH/NEURO: pleasant and cooperative, no obvious depression or anxiety, speech and thought processing grossly intact  ASSESSMENT AND PLAN:  Discussed the following assessment and plan:  Nasal congestion  -we discussed possible serious and likely etiologies, options for evaluation and workup, limitations of telemedicine visit vs in  person visit, treatment, treatment risks and precautions. Pt prefers to treat via telemedicine empirically rather than in person at this moment.  Query viral upper respiratory illness, allergic rhinitis, COVID-19 versus other.  Advised COVID testing, though he does not feel this is COVID.  Advised we are seeing a lot of cases this past week.  Discussed options for symptomatic care with nasal saline, short course nasal decongestant. Advised to  follow-up for seek prompt in person care if worsening, new symptoms arise, or if is not improving with treatment. Discussed options for inperson care if PCP office not available. Did let this patient know that I only do telemedicine on Tuesdays and Thursdays for [place]. Advised to schedule follow up visit with PCP or [facility] if any further questions or concerns to avoid delays in care.   I discussed the assessment and treatment plan with the patient. The patient was provided an opportunity to ask questions and all were answered. The patient agreed with the plan and demonstrated an understanding of the instructions.     Lucretia Kern, DO

## 2020-07-05 NOTE — Patient Instructions (Signed)
  HOME CARE TIPS:  -[place] testing information: https://www.rivera-powers.org/ OR [phone] Most pharmacies also offer testing and home test kits. If the Covid19 test is positive, please make a prompt follow up visit with your primary care office or with [place] to discuss treatment options. Treatments for Covid19 are best given early in the course of the illness.   -can use nasal saline a few times per day if you have nasal congestion; sometimes a short course of Afrin nasal spray for 3 days can help with symptoms as well  -stay hydrated, drink plenty of fluids and eat small healthy meals - avoid dairy  -If the Covid test is positive, check out the [facility] website for more information on home care, transmission and treatment for COVID19  -follow up with your doctor in 2-3 days unless improving and feeling better  -stay home while sick, except to seek medical care. If you have COVID19, ideally it would be best to stay home for a full 10 days since the onset of symptoms PLUS one day of no fever and feeling better. Wear a good mask that fits snugly (such as N95 or KN95) if around others to reduce the risk of transmission.  It was nice to meet you today, and I really hope you are feeling better soon. I help [place] out with telemedicine visits on Tuesdays and Thursdays and am available for visits on those days. If you have any concerns or questions following this visit please schedule a follow up visit with your Primary Care doctor or seek care at a local urgent care clinic to avoid delays in care.    Seek in person care or schedule a follow up video visit promptly if your symptoms worsen, new concerns arise or you are not improving with treatment. Call 911 and/or seek emergency care if your symptoms are severe or life threatening.

## 2020-07-19 ENCOUNTER — Encounter: Payer: Self-pay | Admitting: Family Medicine

## 2020-07-19 NOTE — Telephone Encounter (Signed)
Dr. Jerline Pain, please see message and advise since Dr. Yong Channel is out. Thanks

## 2020-08-30 ENCOUNTER — Other Ambulatory Visit: Payer: Self-pay

## 2020-10-24 ENCOUNTER — Other Ambulatory Visit: Payer: Self-pay | Admitting: Family Medicine

## 2020-10-24 DIAGNOSIS — I10 Essential (primary) hypertension: Secondary | ICD-10-CM

## 2020-11-25 ENCOUNTER — Encounter: Payer: Self-pay | Admitting: Family Medicine

## 2020-11-29 NOTE — Telephone Encounter (Signed)
Thank you, Dr. Yong Channel!

## 2020-12-04 ENCOUNTER — Encounter: Payer: Self-pay | Admitting: Family Medicine

## 2020-12-05 ENCOUNTER — Encounter: Payer: Self-pay | Admitting: Family Medicine

## 2020-12-05 ENCOUNTER — Telehealth (INDEPENDENT_AMBULATORY_CARE_PROVIDER_SITE_OTHER): Payer: 59 | Admitting: Family Medicine

## 2020-12-05 VITALS — Ht 75.0 in | Wt 237.0 lb

## 2020-12-05 DIAGNOSIS — B9689 Other specified bacterial agents as the cause of diseases classified elsewhere: Secondary | ICD-10-CM

## 2020-12-05 DIAGNOSIS — R059 Cough, unspecified: Secondary | ICD-10-CM | POA: Diagnosis not present

## 2020-12-05 DIAGNOSIS — J3489 Other specified disorders of nose and nasal sinuses: Secondary | ICD-10-CM

## 2020-12-05 DIAGNOSIS — I10 Essential (primary) hypertension: Secondary | ICD-10-CM | POA: Diagnosis not present

## 2020-12-05 DIAGNOSIS — J329 Chronic sinusitis, unspecified: Secondary | ICD-10-CM | POA: Diagnosis not present

## 2020-12-05 DIAGNOSIS — E782 Mixed hyperlipidemia: Secondary | ICD-10-CM

## 2020-12-05 MED ORDER — AZITHROMYCIN 250 MG PO TABS: ORAL_TABLET | ORAL | 0 refills | Status: DC

## 2020-12-05 MED ORDER — BENZONATATE 100 MG PO CAPS: 100.0000 mg | ORAL_CAPSULE | Freq: Three times a day (TID) | ORAL | 0 refills | Status: DC | PRN

## 2020-12-05 MED ORDER — AMLODIPINE BESYLATE 5 MG PO TABS: ORAL_TABLET | ORAL | 11 refills | Status: DC

## 2020-12-05 NOTE — Progress Notes (Signed)
Phone [phone] Virtual visit via Video note   Subjective:  Chief complaint: Chief Complaint  Patient presents with   URI    Pt states he's developed symptoms of congestion in his head and chest, sinus pressure, productive cough. Symptoms going on for a week. Pt took an at home Covid test that came back negative.   This visit type was conducted due to national recommendations for restrictions regarding the COVID-19 Pandemic (e.g. social distancing).  This format is felt to be most appropriate for this patient at this time balancing risks to patient and risks to population by having him in for in person visit.  No physical exam was performed (except for noted visual exam or audio findings with Telehealth visits).    Our team/I connected with [name] at 11:40 AM EDT by a video enabled telemedicine application (doxy.me or caregility through epic) and verified that I am speaking with the correct person using two identifiers.  Location patient: [place] Location provider: [facility], office Persons participating in the virtual visit:  patient  Our team/I discussed the limitations of evaluation and management by telemedicine and the availability of in person appointments. In light of current covid-19 pandemic, patient also understands that we are trying to protect them by minimizing in office contact if at all possible.  The patient expressed consent for telemedicine visit and agreed to proceed. Patient understands insurance will be billed.   Past Medical History-  Patient Active Problem List   Diagnosis Date Noted   History of adenomatous polyp of colon 02/01/2017    Priority: Medium    Essential hypertension 12/30/2007    Priority: Medium    HYPERLIPIDEMIA 11/19/2006    Priority: Medium    Tobacco use disorder 07/09/2015    Priority: Low   MVP (mitral valve prolapse) 07/08/2015    Priority: Low   DDD (degenerative disc disease)     Priority: Low   Hx of gout  12/30/2007    Priority: Low   Meniscal injury, left, initial encounter    History of skin cancer     Medications- reviewed and updated Current Outpatient Medications  Medication Sig Dispense Refill   azithromycin (ZITHROMAX) 250 MG tablet Take 2 tabs on day 1, then 1 tab daily until finished 6 tablet 0   benazepril (LOTENSIN) 40 MG tablet TAKE 1 TABLET(40 MG) BY MOUTH DAILY 30 tablet 11   Multiple Vitamins-Minerals (MEGA MULTI MEN PO) Take by mouth daily.     rosuvastatin (CRESTOR) 20 MG tablet TAKE 1 TABLET BY MOUTH THREE TIMES A WEEK. 40 tablet 3   TOPROL XL 50 MG 24 hr tablet TAKE 1 TABLET BY MOUTH EVERY DAY WITH OR IMMEDIATELY FOLLOWING A MEAL 90 tablet 2   amLODipine (NORVASC) 5 MG tablet TAKE 1 TABLET(5 MG) BY MOUTH DAILY 30 tablet 11   benzonatate (TESSALON PERLES) 100 MG capsule Take 1 capsule (100 mg total) by mouth 3 (three) times daily as needed. 20 capsule 0   No current facility-administered medications for this visit.     Objective:  Ht 6\' 3"  (1.905 m)   Wt 237 lb (107.5 kg)   BMI 29.62 kg/m  self reported vitals Gen: NAD, resting comfortably Lungs: nonlabored, normal respiratory rate  Skin: appears dry, no obvious rash     Assessment and Plan   #Sinus pressure with purulent discharge S:A week ago on monday noted frontal sinus pressure that went into his ears and into sore throat as well. Heavy sinus  drainage- noted some into upper chest. A lot of phlegm very discolored with large chunks. Discoloration slightly improved. Significant sinus pressure/fullness still. No chest pain or shortness of breath. No fever  . Cannot sleep well at night- coughs some and even starts to gag- bad gag reflex and can even vomit. Still with bad cough. Wife beth also sick with similar.  -covid negative on home test last night - grandson was ill and he was around him - pcn allergy - some worsening of symptoms in last 2 days- in particular feels more fatigued - nyquil helps some- HBP  version A/P: Patient with 7 to 8 days of sinus pressure and purulent discharge that seems to be worsening instead of improving concerning for bacterial sinusitis-penicillin allergy as above.  Will treat with azithromycin which she has done well with in the past-may consider doxycycline if fails to  to improve or worsens. -I do not think doing a PCR test will give [place] additional helpful information  #hypertension S: medication: Benazepril 40Mg , Amlodipine 2.5Mg , Toprol XL 50Mg  (chronic cough on generic) Home readings #s: typically running in 150s/80s in last year or two BP Readings from Last 3 Encounters:  03/03/20 (!) 144/90  03/03/18 (!) 146/76  02/19/18 122/62  A/P: Blood pressure has been high in office in the past and now reported 150 home readings systolic-we will trial amlodipine 5 mg due to poor control and see him on Thursday to recheck -bring cuff to visit to make sure accurate   Recommended follow up:  Future Appointments  Date Time Provider [place]  12/08/2020  2:20 PM Marin Olp, MD [facility]  03/07/2021  2:40 PM Marin Olp, MD [facility]    Lab/Order associations:   ICD-10-CM   1. Bacterial sinusitis  J32.9    B96.89     2. Sinus pressure  J34.89     3. Cough, unspecified type  R05.9     4. Essential hypertension  I10     5. HYPERLIPIDEMIA  E78.2       Meds ordered this encounter  Medications   azithromycin (ZITHROMAX) 250 MG tablet    Sig: Take 2 tabs on day 1, then 1 tab daily until finished    Dispense:  6 tablet    Refill:  0   benzonatate (TESSALON PERLES) 100 MG capsule    Sig: Take 1 capsule (100 mg total) by mouth 3 (three) times daily as needed.    Dispense:  20 capsule    Refill:  0   amLODipine (NORVASC) 5 MG tablet    Sig: TAKE 1 TABLET(5 MG) BY MOUTH DAILY    Dispense:  30 tablet    Refill:  11    I,Jada Bradford,acting as a scribe for Garret Reddish, MD.,have documented all relevant documentation on the behalf of  Garret Reddish, MD,as directed by  Garret Reddish, MD while in the presence of Garret Reddish, MD.   I, Garret Reddish, MD, have reviewed all documentation for this visit. The documentation on 12/05/20 for the exam, diagnosis, procedures, and orders are all accurate and complete.   Return precautions advised.  Garret Reddish, MD

## 2020-12-05 NOTE — Telephone Encounter (Signed)
Patient is scheduled   

## 2020-12-08 ENCOUNTER — Ambulatory Visit (INDEPENDENT_AMBULATORY_CARE_PROVIDER_SITE_OTHER): Payer: 59 | Admitting: Family Medicine

## 2020-12-08 ENCOUNTER — Other Ambulatory Visit: Payer: Self-pay

## 2020-12-08 ENCOUNTER — Encounter: Payer: Self-pay | Admitting: Family Medicine

## 2020-12-08 VITALS — BP 148/72 | HR 74 | Temp 97.9°F | Ht 75.0 in | Wt 245.0 lb

## 2020-12-08 DIAGNOSIS — E782 Mixed hyperlipidemia: Secondary | ICD-10-CM | POA: Diagnosis not present

## 2020-12-08 DIAGNOSIS — I10 Essential (primary) hypertension: Secondary | ICD-10-CM

## 2020-12-08 DIAGNOSIS — I499 Cardiac arrhythmia, unspecified: Secondary | ICD-10-CM

## 2020-12-08 DIAGNOSIS — J329 Chronic sinusitis, unspecified: Secondary | ICD-10-CM

## 2020-12-08 DIAGNOSIS — B9689 Other specified bacterial agents as the cause of diseases classified elsewhere: Secondary | ICD-10-CM

## 2020-12-08 DIAGNOSIS — I493 Ventricular premature depolarization: Secondary | ICD-10-CM

## 2020-12-08 NOTE — Patient Instructions (Addendum)
Health Maintenance Due  Topic Date Due   COVID-19 Vaccine (1) - Discuss with patient.  Never done   Zoster Vaccines- Shingrix (1 of 2) - Please check with your pharmacy to see if they have the shingrix vaccine. If they do- please get this immunization and update [place] by phone call or mychart with dates you receive the vaccine  Never done   Pneumonia Vaccine 12+ Years old (1 - PCV) - Discuss with patient.  Never done   INFLUENZA VACCINE  - High dose flu shot  Never done   Please do some home monitoring over the next week of your blood pressure readings at home while taking amlodipine 5 mg and update me with readings on a message through [facility] . If not improving, we may need to increase you to Amlodipine 10 mg.  We will call you within two weeks about your referral to cardiology. If you do not hear within 2 weeks, give [place] a call.   Your bacterial sinusitis seems to be improving - please finish your course of antibiotics and update me at the beginning of next week to let me know if there is improvement or worsening in symptoms.  For weight loss, you could try the MYFitnessPal mobile app to help with your caloric intake or try the half plate method discussed today.  I am glad to hear that you have reduced you alcohol intake - keep up the good work!  Recommended follow up: No follow-ups on file.

## 2020-12-08 NOTE — Progress Notes (Signed)
Phone [phone] In person visit   Subjective:   Bradley Cole. is a 65 y.o. year old very pleasant male patient who presents for/with See problem oriented charting Chief Complaint  Patient presents with   Hypertension    160/74 w/ office cuff 146/80 w/ home cuff    This visit occurred during the SARS-CoV-2 public health emergency.  Safety protocols were in place, including screening questions prior to the visit, additional usage of staff PPE, and extensive cleaning of exam room while observing appropriate contact time as indicated for disinfecting solutions.   Past Medical History-  Patient Active Problem List   Diagnosis Date Noted   History of adenomatous polyp of colon 02/01/2017    Priority: Medium    Essential hypertension 12/30/2007    Priority: Medium    HYPERLIPIDEMIA 11/19/2006    Priority: Medium    Tobacco use disorder 07/09/2015    Priority: Low   MVP (mitral valve prolapse) 07/08/2015    Priority: Low   DDD (degenerative disc disease)     Priority: Low   Hx of gout 12/30/2007    Priority: Low   Meniscal injury, left, initial encounter    History of skin cancer     Medications- reviewed and updated Current Outpatient Medications  Medication Sig Dispense Refill   amLODipine (NORVASC) 5 MG tablet TAKE 1 TABLET(5 MG) BY MOUTH DAILY 30 tablet 11   azithromycin (ZITHROMAX) 250 MG tablet Take 2 tabs on day 1, then 1 tab daily until finished 6 tablet 0   benazepril (LOTENSIN) 40 MG tablet TAKE 1 TABLET(40 MG) BY MOUTH DAILY 30 tablet 11   benzonatate (TESSALON PERLES) 100 MG capsule Take 1 capsule (100 mg total) by mouth 3 (three) times daily as needed. 20 capsule 0   Multiple Vitamins-Minerals (MEGA MULTI MEN PO) Take by mouth daily.     rosuvastatin (CRESTOR) 20 MG tablet TAKE 1 TABLET BY MOUTH THREE TIMES A WEEK. 40 tablet 3   TOPROL XL 50 MG 24 hr tablet TAKE 1 TABLET BY MOUTH EVERY DAY WITH OR IMMEDIATELY FOLLOWING A MEAL 90 tablet 2   No current  facility-administered medications for this visit.     Objective:  BP (!) 148/72   Pulse 74   Temp 97.9 F (36.6 C) (Temporal)   Ht 6\' 3"  (1.905 m)   Wt 245 lb (111.1 kg)   SpO2 99%   BMI 30.62 kg/m  Gen: NAD, resting comfortably no sinus pressure on exam CV: RRR but occasional ectopic beat noted   lungs: CTAB no crackles, wheeze, rhonchi Ext: no edema Skin: warm, dry   EKG: sinus rhythm with rate 66 and single PVC noted, left axis, normal intervals, no hypertrophy, no st or t wave changes-evidence of incomplete right bundle branch block noted    Assessment and Plan   #Social Update: daughter beth died May 14, 2020 breast cancer mets- wants to be around for grandkids for at least 25 years   #hypertension S: medication: amlodipine 5 mg daily - increased from 2.5 mg, benazepril 40 mg daily, and Toprol XL 50 mg (chronic cough with generic) Home readings #s: usually 150s/70s or 80s before increasing amlodipine to 5 mg- has not checked at home since that time -cut down bourbon to 3 glasses a month  158/82 home reading, my reading was 154/64 when checked close afterwards BP Readings from Last 3 Encounters:  12/08/20 (!) 148/72  03/03/20 (!) 144/90  03/03/18 (!) 146/76  A/P: blood pressure seems to be  trending down overall- id like for him to monitor at home over the next week then update me with readings - for now continue current meds but may need to increase to amlodipine 10 mg and if that is not effective may need to add another med like  a diuretic - hesitant with hctz due to gout  # irregular heart rate S:he has noted this on his home BP cuff- getting alert for irregular heartbeat. On exam does have some occasional skips . Last ekg 2012 A/P: with irregular heart rate will update ekg  -PVC x1 noted on EKG- may have been what I noted on exam. Discussed with his desire to be more aggressive about health and some other mild changes on EKG (left axis), incomplete right bundle- will get  cardiology consult -they can also weigh in on hypertension  #hyperlipidemia S: Medication:rosuvastatin 20 mg 3 times a week-increased last visit. No issues with that Lab Results  Component Value Date   CHOL 188 03/01/2020   HDL 44.30 03/01/2020   LDLCALC 77 12/10/2017   LDLDIRECT 112.0 03/01/2020   TRIG 222.0 (H) 03/01/2020   CHOLHDL 4 03/01/2020   A/P: we will plan on rechecking this in January- for now continue current meds  # Bacterial sinusitis S:Patient connected virtually on 12/05/2020 where he reported that a week prior he noted frontal sinus pressure that went into his ears and into sore throat as well. Heavy sinus drainage- noted some into upper chest. A lot of phlegm that was very discolored with large chunks - discoloration slightly improved. -covid negative on home test the night prior to visit - grandson was ill and he was around him - pcn allergy - some worsening of symptoms in last 2 days- in particular feels more fatigued - nyquil helps some- HBP version - patient was treated with azithromycin and may consider doxycycline if symptoms failed to improve or worsened  He has noted since starting medicine significant improvement in purulence - but still getting decent volume of discharge. Not feeling sinus pressure but feeling post nasal drip still A/P: seems to be improving- continue course and can update [place] next week.  -if fails to continue to improve could trial doxycyline due to pcn allergy    Recommended follow up: No follow-ups on file. Future Appointments  Date Time Provider [place]  03/07/2021  2:40 PM Marin Olp, MD [facility]    Lab/Order associations:   ICD-10-CM   1. Essential hypertension  I10 Ambulatory referral to Cardiology    2. HYPERLIPIDEMIA  E78.2     3. Bacterial sinusitis  J32.9    B96.89     4. Irregular heart beat  I49.9 EKG 12-Lead    Ambulatory referral to Cardiology    5. PVC (premature ventricular contraction)   I49.3 Ambulatory referral to Cardiology     I,Harris Phan,acting as a scribe for Garret Reddish, MD.,have documented all relevant documentation on the behalf of Garret Reddish, MD,as directed by  Garret Reddish, MD while in the presence of Garret Reddish, MD.  I, Garret Reddish, MD, have reviewed all documentation for this visit. The documentation on 12/08/20 for the exam, diagnosis, procedures, and orders are all accurate and complete.   Return precautions advised.  Garret Reddish, MD

## 2020-12-19 ENCOUNTER — Encounter: Payer: Self-pay | Admitting: Family Medicine

## 2021-01-08 ENCOUNTER — Other Ambulatory Visit: Payer: Self-pay | Admitting: Family Medicine

## 2021-01-09 ENCOUNTER — Other Ambulatory Visit: Payer: Self-pay | Admitting: Family Medicine

## 2021-01-09 NOTE — Progress Notes (Signed)
Cardiology Office Note:    Date:  01/09/2021   ID:  Bradley Cole., DOB [birth year], MRN [id number]  PCP:  Marin Olp, MD   [facility] Providers Cardiologist:  None     Referring MD: Marin Olp, MD   No chief complaint on file. Abnormal EKG  History of Present Illness:    Bradley Cole. is a 65 y.o. male with a hx below, referral for  x1 PVC, IRBBB and stage 2 hypertension referral for cardiac assessment  He was told that he was referred here to ensure to ensure he does not have cardiac risk. He was told he has hx of MVP. He is helping with his grandson, since their mother died of cancer. He owns a company as a Solicitor. It is stressful. He exercise 3x per week. 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 No chest pressure , no shortness of breath. Did not report LH, dizziness or syncope. No smoking cigarettes. His mother had a valve replacement, and she passed and she was 21. Father no heart disease. Brother has no heart disease  In terms of cardiac hx. He has a stress test in his 22s and it was normal . He was told he had a mitral valve prolapse from the exam.. This was with Dr. Linna Darner.   For his HTN, he's on norvasc 5 mg daily increased form 2.5 mg, benazepril 40 mg daily  and tropol XL 50 mg. He takes blood pressures 140s at home. He notes no report of OSA.  CVD Risk/Equivalent: HLD- yes, on crestor HTN- yes on therapy PAD- No DMII- No Smoker-cigars in the past Premature Family History- no  03/01/2020 LDL 112, TC 188 Crt 0.94  TSH 1.6  Past Medical History:  Diagnosis Date   DDD (degenerative disc disease)    Degenerative disc disease, lumbar    Degenerative disc disease, lumbar    Heart murmur    History of skin cancer    nose- with Dr. Ubaldo Glassing    Hyperlipidemia    LDL goal < 100, ideally < 70   Hypertension    Meniscal injury, left, initial encounter    [facility] following- has a flap. will wait on surgery until cannot bear  it.     Past Surgical History:  Procedure Laterality Date   COLONOSCOPY  2007   negative, [facility]   KNEE SURGERY     R knee torn ligament   POLYPECTOMY      Current Medications: No outpatient medications have been marked as taking for the 01/10/21 encounter (Appointment) with Janina Mayo, MD.     Allergies:   Augmentin [amoxicillin-pot clavulanate] and Penicillins   Social History   Socioeconomic History   Marital status: Married    Spouse name: Not on file   Number of children: Not on file   Years of education: Not on file   Highest education level: Not on file  Occupational History   Not on file  Tobacco Use   Smoking status: Former    Types: Cigars    Quit date: 11/07/2010    Years since quitting: 10.1   Smokeless tobacco: Former    Types: Chew   Tobacco comments:    quite smoking a year ago.  Vaping Use   Vaping Use: Never used  Substance and Sexual Activity   Alcohol use: Yes    Alcohol/week: 3.0 standard drinks    Types: 3 Glasses of wine per week  Comment: socially    Drug use: No   Sexual activity: Not on file  Other Topics Concern   Not on file  Social History Narrative   Married 37 years in 2017. 2 children 85 and 61. 4 grandkids (1 on way included) in 2017.       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)   Plans to never retire      Hobbies: maintaining farm, judging field trials- any kind of Risk manager, Immunologist to sort cattle   Social Determinants of Radio broadcast assistant Strain: Not on file  Food Insecurity: Not on file  Transportation Needs: Not on file  Physical Activity: Not on file  Stress: Not on file  Social Connections: Not on file     Family History: The patient's family history includes Alcohol abuse in his son; Benign prostatic hyperplasia in his father; Diabetes in his father and paternal grandmother; Glaucoma in his father; Heart attack in his maternal grandfather and mother; Heart disease in  his maternal grandfather, maternal grandmother, and mother; Hypertension in his father, mother, and paternal grandmother; Lymphoma in his father; Macular degeneration in his father; Migraines in his mother; Mitral valve prolapse in his father. There is no history of Stroke, Colon cancer, Stomach cancer, or Rectal cancer.  ROS:   Please see the history of present illness.     All other systems reviewed and are negative.  EKGs/Labs/Other Studies Reviewed:    The following studies were reviewed today:  EKG:  EKG is  ordered today.  The ekg ordered today demonstrates   NSR, 1xPVC, LAD  Recent Labs: 03/01/2020: ALT 24; BUN 14; Creatinine, Ser 0.94; Hemoglobin 14.0; Platelets 278.0; Potassium 4.2; Sodium 138; TSH 1.61  Recent Lipid Panel    Component Value Date/Time   CHOL 188 03/01/2020 1113   CHOL 164 07/09/2014 0732   TRIG 222.0 (H) 03/01/2020 1113   TRIG 158 (H) 07/09/2014 0732   TRIG 85 12/11/2005 0811   HDL 44.30 03/01/2020 1113   HDL 44 07/09/2014 0732   CHOLHDL 4 03/01/2020 1113   VLDL 44.4 (H) 03/01/2020 1113   LDLCALC 77 12/10/2017 0816   LDLCALC 88 07/09/2014 0732   LDLDIRECT 112.0 03/01/2020 1113     Risk Assessment/Calculations:     The 10-year ASCVD risk score (Arnett DK, et al., 2019) is: 19.6%   Values used to calculate the score:     Age: 85 years     Sex: Male     Is Non-Hispanic African American: No     Diabetic: No     Tobacco smoker: No     Systolic Blood Pressure: 427 mmHg     Is BP treated: Yes     HDL Cholesterol: 44.3 mg/dL     Total Cholesterol: 188 mg/dL   Physical Exam:    VS:    Vitals:   01/10/21 0825  BP: (!) 150/73  Pulse: 65  SpO2: 99%     Wt Readings from Last 3 Encounters:  12/08/20 245 lb (111.1 kg)  12/05/20 237 lb (107.5 kg)  03/03/20 245 lb (111.1 kg)     GEN:  Well nourished, well developed in no acute distress HEENT: Normal NECK: No JVD; No carotid bruits LYMPHATICS: No lymphadenopathy CARDIAC: RRR, no murmurs,  rubs, gallops RESPIRATORY:  Clear to auscultation without rales, wheezing or rhonchi  ABDOMEN: Soft, non-tender, non-distended MUSCULOSKELETAL:  No edema; No deformity  SKIN: Warm and dry NEUROLOGIC:  Alert and oriented x  3 PSYCHIATRIC:  Normal affect   ASSESSMENT:    #Abnormal EKG: His EKG showed left axis deviation and on PVC. He has no anginal symptoms. He has no signs of CHF. He has no signs of arrhythmia.  #Stage 2 Hypertension: increased norvasc to 10 mg from 5 mg daily. Continue benazepril 40 mg daily  and tropol XL 50 mg.   #CVD RISK: He is on crestor 20 mg, He mentioned a trial of stopping and then restarting. His lipids are followed by his primary care.TC 188, HDL 43. LDL 112. Will repeat lipids on his return visit and determine if he is at goal, if not will need to increase his crestor dose.   PLAN:    In order of problems listed above:  TTE Increased norvasc 10 mg Follow up 3 months      Medication Adjustments/Labs and Tests Ordered: Current medicines are reviewed at length with the patient today.  Concerns regarding medicines are outlined above.   Signed, Janina Mayo, MD  01/09/2021 2:06 PM    [facility]

## 2021-01-10 ENCOUNTER — Other Ambulatory Visit: Payer: Self-pay

## 2021-01-10 ENCOUNTER — Ambulatory Visit (INDEPENDENT_AMBULATORY_CARE_PROVIDER_SITE_OTHER): Payer: 59 | Admitting: Internal Medicine

## 2021-01-10 ENCOUNTER — Encounter: Payer: Self-pay | Admitting: Internal Medicine

## 2021-01-10 VITALS — BP 150/73 | HR 65 | Ht 75.0 in | Wt 246.8 lb

## 2021-01-10 DIAGNOSIS — I341 Nonrheumatic mitral (valve) prolapse: Secondary | ICD-10-CM

## 2021-01-10 DIAGNOSIS — R9431 Abnormal electrocardiogram [ECG] [EKG]: Secondary | ICD-10-CM

## 2021-01-10 MED ORDER — AMLODIPINE BESYLATE 10 MG PO TABS: 10.0000 mg | ORAL_TABLET | Freq: Every day | ORAL | 3 refills | Status: DC

## 2021-01-10 NOTE — Patient Instructions (Signed)
Medication Instructions:  PLEASE START: AMLODIPINE 10mg  ONCE DAILY  *If you need a refill on your cardiac medications before your next appointment, please call your pharmacy*  Testing/Procedures: Your physician has requested that you have an echocardiogram. Echocardiography is a painless test that uses sound waves to create images of your heart. It provides your doctor with information about the size and shape of your heart and how well your heart's chambers and valves are working. You may receive an ultrasound enhancing agent through an IV if needed to better visualize your heart during the echo.This procedure takes approximately one hour. There are no restrictions for this procedure. This will take place at the [address].   Follow-Up: At [facility], you and your health needs are our priority.  As part of our continuing mission to provide you with exceptional heart care, we have created designated Provider Care Teams.  These Care Teams include your primary Cardiologist (physician) and Advanced Practice Providers (APPs -  Physician Assistants and Nurse Practitioners) who all work together to provide you with the care you need, when you need it.  Your next appointment:   3 month(s)  The format for your next appointment:   In Person  Provider:   Janina Mayo, MD

## 2021-01-17 ENCOUNTER — Other Ambulatory Visit: Payer: Self-pay | Admitting: Family Medicine

## 2021-01-20 ENCOUNTER — Encounter: Payer: Self-pay | Admitting: Family Medicine

## 2021-01-20 ENCOUNTER — Telehealth (INDEPENDENT_AMBULATORY_CARE_PROVIDER_SITE_OTHER): Payer: 59 | Admitting: Family Medicine

## 2021-01-20 DIAGNOSIS — J208 Acute bronchitis due to other specified organisms: Secondary | ICD-10-CM | POA: Diagnosis not present

## 2021-01-20 DIAGNOSIS — B9689 Other specified bacterial agents as the cause of diseases classified elsewhere: Secondary | ICD-10-CM | POA: Diagnosis not present

## 2021-01-20 DIAGNOSIS — I10 Essential (primary) hypertension: Secondary | ICD-10-CM

## 2021-01-20 MED ORDER — AZITHROMYCIN 250 MG PO TABS: ORAL_TABLET | ORAL | 0 refills | Status: DC

## 2021-01-20 MED ORDER — AMLODIPINE BESYLATE 10 MG PO TABS: 5.0000 mg | ORAL_TABLET | Freq: Every day | ORAL | 3 refills | Status: DC

## 2021-01-20 NOTE — Progress Notes (Signed)
Virtual Visit via Video Note  Subjective  CC:  Chief Complaint  Patient presents with   Sore Throat   Cough    Productive cough, has some color   Nasal Congestion    Ongoing for about a week   Ear Pain    Bilateral pain, more on right side   Same day acute visit; PCP not available. New pt to me. Chart reviewed.   I connected with [name] on 01/20/21 at 10:00 AM EST by a video enabled telemedicine application and verified that I am speaking with the correct person using two identifiers. Location patient: Home Location provider: [facility] [place], Office Persons participating in the virtual visit: Bradley Cole., Leamon Arnt, MD Vassie Moselle CMA  I discussed the limitations of evaluation and management by telemedicine and the availability of in person appointments. The patient expressed understanding and agreed to proceed. HPI: Bradley Cole. is a 65 y.o. male who was contacted today to address the problems listed above in the chief complaint. 65 year old male with hypertension complains of almost 1 week history of upper respiratory tract infection symptoms including bilateral ear pain, sore throat, postnasal drainage, productive cough without shortness of breath, fever, nausea vomiting, diarrhea or abdominal pain.  Multiple sick contacts in his office.  He had a sinus infection back in late October and 1 in the summer.  He was treated with a Z-Pak for both.  He is requesting a Z-Pak.  He reports that the Z-Pak helped him immediately with his last infection.  He is a non-smoker, positive former smoker.  No history of asthma or COPD.  He is not tested for COVID.  He had COVID this summer.  He is fully vaccinated and boosted.  He is taking this telehealth visit from work. Hypertension: Patient did see cardiology.  Amlodipine dose was increased from 5 to 10 mg daily.  Assessment  1. Acute bacterial bronchitis   2. Essential  hypertension      Plan  Bronchitis: Discussed etiologies including viral versus bacterial etiologies.  Given persistent course and patient's preference, Z-Pak ordered.  Discussed possible viral infection.  Continue Mucinex and Tylenol for symptoms.  No red flag symptoms noted.  Patient declines COVID testing at this time. Hypertension: Avoid decongestants.  Updated medication list.  Follow-up with PCP as recommended I discussed the assessment and treatment plan with the patient. The patient was provided an opportunity to ask questions and all were answered. The patient agreed with the plan and demonstrated an understanding of the instructions.   The patient was advised to call back or seek an in-person evaluation if the symptoms worsen or if the condition fails to improve as anticipated. Follow up: As needed 03/07/2021  Meds ordered this encounter  Medications   azithromycin (ZITHROMAX) 250 MG tablet    Sig: Take 2 tabs today, then 1 tab daily for 4 days    Dispense:  1 each    Refill:  0   amLODipine (NORVASC) 10 MG tablet    Sig: Take 0.5 tablets (5 mg total) by mouth daily.    Dispense:  90 tablet    Refill:  3       I reviewed the patients updated PMH, FH, and SocHx.    Patient Active Problem List   Diagnosis Date Noted   Meniscal injury, left, initial encounter    History of skin cancer    History of  adenomatous polyp of colon 02/01/2017   Tobacco use disorder 07/09/2015   MVP (mitral valve prolapse) 07/08/2015   DDD (degenerative disc disease)    Hx of gout 12/30/2007   Essential hypertension 12/30/2007   HYPERLIPIDEMIA 11/19/2006   Current Meds  Medication Sig   azithromycin (ZITHROMAX) 250 MG tablet Take 2 tabs today, then 1 tab daily for 4 days   benazepril (LOTENSIN) 40 MG tablet TAKE 1 TABLET(40 MG) BY MOUTH DAILY   Multiple Vitamins-Minerals (MEGA MULTI MEN PO) Take by mouth daily.   rosuvastatin (CRESTOR) 20 MG tablet TAKE 1 TABLET BY MOUTH THREE TIMES A WEEK.    TOPROL XL 50 MG 24 hr tablet TAKE 1 TABLET BY MOUTH EVERY DAY WITH OR IMMEDIATELY FOLLOWING A MEAL   [DISCONTINUED] amLODipine (NORVASC) 10 MG tablet Take 1 tablet (10 mg total) by mouth daily. TAKE 1 TABLET(5 MG) BY MOUTH DAILY    Allergies: Patient is allergic to augmentin [amoxicillin-pot clavulanate] and penicillins. Family History: Patient family history includes Alcohol abuse in his son; Benign prostatic hyperplasia in his father; Diabetes in his father and paternal grandmother; Glaucoma in his father; Heart attack in his maternal grandfather and mother; Heart disease in his maternal grandfather, maternal grandmother, and mother; Hypertension in his father, mother, and paternal grandmother; Lymphoma in his father; Macular degeneration in his father; Migraines in his mother; Mitral valve prolapse in his father. Social History:  Patient  reports that he quit smoking about 10 years ago. His smoking use included cigars. He has quit using smokeless tobacco.  His smokeless tobacco use included chew. He reports current alcohol use of about 3.0 standard drinks per week. He reports that he does not use drugs.  Review of Systems: Constitutional: Negative for fever malaise or anorexia Cardiovascular: negative for chest pain Respiratory: negative for SOB or persistent cough Gastrointestinal: negative for abdominal pain  OBJECTIVE Vitals: There were no vitals taken for this visit. General: no acute distress , A&Ox3 no respiratory distress Nasal congestion present. Appears well  Leamon Arnt, MD

## 2021-01-20 NOTE — Telephone Encounter (Signed)
Patient is scheduled   

## 2021-01-31 ENCOUNTER — Ambulatory Visit (HOSPITAL_COMMUNITY): Payer: 59 | Attending: Cardiovascular Disease

## 2021-01-31 ENCOUNTER — Other Ambulatory Visit: Payer: Self-pay

## 2021-01-31 ENCOUNTER — Encounter: Payer: Self-pay | Admitting: Family Medicine

## 2021-01-31 DIAGNOSIS — I341 Nonrheumatic mitral (valve) prolapse: Secondary | ICD-10-CM | POA: Diagnosis present

## 2021-01-31 DIAGNOSIS — R9431 Abnormal electrocardiogram [ECG] [EKG]: Secondary | ICD-10-CM | POA: Diagnosis present

## 2021-01-31 LAB — ECHOCARDIOGRAM COMPLETE
Area-P 1/2: 3.77 cm2
S' Lateral: 3.5 cm

## 2021-03-01 ENCOUNTER — Encounter: Payer: Self-pay | Admitting: Family Medicine

## 2021-03-01 DIAGNOSIS — E782 Mixed hyperlipidemia: Secondary | ICD-10-CM

## 2021-03-01 DIAGNOSIS — Z0000 Encounter for general adult medical examination without abnormal findings: Secondary | ICD-10-CM

## 2021-03-01 DIAGNOSIS — Z125 Encounter for screening for malignant neoplasm of prostate: Secondary | ICD-10-CM

## 2021-03-06 ENCOUNTER — Other Ambulatory Visit: Payer: Self-pay

## 2021-03-06 ENCOUNTER — Other Ambulatory Visit (INDEPENDENT_AMBULATORY_CARE_PROVIDER_SITE_OTHER): Payer: 59

## 2021-03-06 DIAGNOSIS — E782 Mixed hyperlipidemia: Secondary | ICD-10-CM

## 2021-03-06 DIAGNOSIS — Z125 Encounter for screening for malignant neoplasm of prostate: Secondary | ICD-10-CM

## 2021-03-06 DIAGNOSIS — Z0000 Encounter for general adult medical examination without abnormal findings: Secondary | ICD-10-CM

## 2021-03-06 LAB — LIPID PANEL
Cholesterol: 137 mg/dL (ref 0–200)
HDL: 40.9 mg/dL (ref >39.00)
LDL Cholesterol: 72 mg/dL (ref 0–99)
NonHDL: 95.97
Total CHOL/HDL Ratio: 3
Triglycerides: 120 mg/dL (ref 0.0–149.0)
VLDL: 24 mg/dL (ref 0.0–40.0)

## 2021-03-06 LAB — CBC WITH DIFFERENTIAL/PLATELET
Basophils Absolute: 0 10*3/uL (ref 0.0–0.1)
Basophils Relative: 0.3 % (ref 0.0–3.0)
Eosinophils Absolute: 0.2 10*3/uL (ref 0.0–0.7)
Eosinophils Relative: 2.6 % (ref 0.0–5.0)
HCT: 40.4 % (ref 39.0–52.0)
Hemoglobin: 13.5 g/dL (ref 13.0–17.0)
Lymphocytes Relative: 31.7 % (ref 12.0–46.0)
Lymphs Abs: 2.1 10*3/uL (ref 0.7–4.0)
MCHC: 33.3 g/dL (ref 30.0–36.0)
MCV: 87.1 fl (ref 78.0–100.0)
Monocytes Absolute: 0.7 10*3/uL (ref 0.1–1.0)
Monocytes Relative: 10.4 % (ref 3.0–12.0)
Neutro Abs: 3.6 10*3/uL (ref 1.4–7.7)
Neutrophils Relative %: 55 % (ref 43.0–77.0)
Platelets: 255 10*3/uL (ref 150.0–400.0)
RBC: 4.64 Mil/uL (ref 4.22–5.81)
RDW: 13.7 % (ref 11.5–15.5)
WBC: 6.6 10*3/uL (ref 4.0–10.5)

## 2021-03-06 LAB — COMPREHENSIVE METABOLIC PANEL
ALT: 20 U/L (ref 0–53)
AST: 17 U/L (ref 0–37)
Albumin: 4.3 g/dL (ref 3.5–5.2)
Alkaline Phosphatase: 70 U/L (ref 39–117)
BUN: 17 mg/dL (ref 6–23)
CO2: 27 mEq/L (ref 19–32)
Calcium: 9.7 mg/dL (ref 8.4–10.5)
Chloride: 105 mEq/L (ref 96–112)
Creatinine, Ser: 0.98 mg/dL (ref 0.40–1.50)
GFR: 80.63 mL/min (ref >60.00)
Glucose, Bld: 88 mg/dL (ref 70–99)
Potassium: 4.6 mEq/L (ref 3.5–5.1)
Sodium: 141 mEq/L (ref 135–145)
Total Bilirubin: 0.8 mg/dL (ref 0.2–1.2)
Total Protein: 7 g/dL (ref 6.0–8.3)

## 2021-03-06 LAB — PSA: PSA: 3.67 ng/mL (ref 0.10–4.00)

## 2021-03-06 NOTE — Progress Notes (Signed)
Phone: [phone]   Subjective:  Patient presents today for their annual physical. Chief complaint-noted.   See problem oriented charting- ROS- full  review of systems was completed and negative per full ros sheet  The following were reviewed and entered/updated in epic: Past Medical History:  Diagnosis Date   DDD (degenerative disc disease)    Degenerative disc disease, lumbar    Degenerative disc disease, lumbar    Heart murmur    History of skin cancer    nose- with Dr. Ubaldo Glassing    Hyperlipidemia    LDL goal < 100, ideally < 70   Hypertension    Meniscal injury, left, initial encounter    [facility] following- has a flap. will wait on surgery until cannot bear it.    Patient Active Problem List   Diagnosis Date Noted   History of adenomatous polyp of colon 02/01/2017    Priority: Medium    Essential hypertension 12/30/2007    Priority: Medium    HYPERLIPIDEMIA 11/19/2006    Priority: Medium    Tobacco use disorder 07/09/2015    Priority: Low   MVP (mitral valve prolapse) 07/08/2015    Priority: Low   DDD (degenerative disc disease)     Priority: Low   Hx of gout 12/30/2007    Priority: Low   Meniscal injury, left, initial encounter    History of skin cancer    Past Surgical History:  Procedure Laterality Date   COLONOSCOPY  2007   negative,  GI   KNEE SURGERY     R knee torn ligament   POLYPECTOMY      Family History  Problem Relation Age of Onset   Hypertension Father    Mitral valve prolapse Father    Lymphoma Father        mantel cell   Glaucoma Father    Macular degeneration Father    Benign prostatic hyperplasia Father    Diabetes Father    Hypertension Mother    Heart disease Mother        valvular heart disease   Heart attack Mother        based on findings @ valve replacement   Migraines Mother    Heart attack Maternal Grandfather        in 39s   Heart disease Maternal Grandfather    Diabetes Paternal Grandmother     Hypertension Paternal Grandmother    Alcohol abuse Son    Heart disease Maternal Grandmother    Stroke Neg Hx    Colon cancer Neg Hx    Stomach cancer Neg Hx    Rectal cancer Neg Hx     Medications- reviewed and updated Current Outpatient Medications  Medication Sig Dispense Refill   amLODipine (NORVASC) 10 MG tablet Take 0.5 tablets (5 mg total) by mouth daily. 90 tablet 3   benazepril (LOTENSIN) 40 MG tablet TAKE 1 TABLET(40 MG) BY MOUTH DAILY 90 tablet 0   Multiple Vitamins-Minerals (MEGA MULTI MEN PO) Take by mouth daily.     rosuvastatin (CRESTOR) 20 MG tablet TAKE 1 TABLET BY MOUTH THREE TIMES A WEEK. 40 tablet 3   TOPROL XL 50 MG 24 hr tablet TAKE 1 TABLET BY MOUTH EVERY DAY WITH OR IMMEDIATELY FOLLOWING A MEAL 90 tablet 2   No current facility-administered medications for this visit.    Allergies-reviewed and updated Allergies  Allergen Reactions   Augmentin [Amoxicillin-Pot Clavulanate] Rash    Severe rash over entire body   Penicillins  Rash @ 14; he has had Ampicillin since w/o adverse  effect    Social History   Social History Narrative   Married 37 years in 2017. 2 children 59 and 32. 4 grandkids (1 on way included) in 2017.       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 Oropharynx normal Neck: no thyromegaly CV: RRR no murmurs rubs or gallops Lungs: CTAB no crackles, wheeze, rhonchi Abdomen: soft/nontender/nondistended/normal bowel sounds. No rebound or guarding.  Ext: trace edema Skin: warm, dry Neuro: grossly normal, moves all extremities, PERRLA Rectal: normal tone,  diffusely enlarged prostate, no masses or tenderness    Assessment and Plan  66 y.o. male presenting for annual physical.  Health Maintenance counseling: 1. Anticipatory guidance: Patient counseled regarding regular dental exams - advised q6 months, eye exams -yearly advised,  avoiding smoking and second hand smoke , limiting alcohol to 2 beverages per day-less than that- just on weekends. No illicit drugs  2. Risk factor reduction:  Advised patient of need for regular exercise and diet rich and fruits and vegetables to reduce risk of heart attack and stroke.  Exercise- 3-4x a week at the gym- weights and cardio and then cardio other day Diet/weight management-weight up slightly through holidays. Would like to drop 10 lbs. Discussed myfitnesspal Wt Readings from Last 3 Encounters:  03/07/21 250 lb 3.2 oz (113.5 kg)  01/10/21 246 lb 12.8 oz (111.9 kg)  12/08/20 245 lb (111.1 kg)  3. Immunizations/screenings/ancillary studies DISCUSSED:  -COVID booster vaccine #1-he will send [place] the dates of these -Shingrix vaccine #1- agrees -Flu vaccine -declines -Prevnar-20 vaccine #1- declines for now Immunization History  Administered Date(s) Administered   Tdap 03/03/2012, 07/08/2015  4. Prostate cancer screening-  low risk based off psa trend previously but PSA increased on check yesterday.  will get rectal and recheck PSA in 1 month- if not improved refer to urology . Nocturia twice a night but has been drinking more water and drinking water at night due to some dry mouth on amlodipine.  -father with issues of prostate but never diagnosed with cancer. No family history.  Lab Results  Component Value Date   PSA 3.67 03/06/2021   PSA 1.06 03/01/2020   PSA 1.18 12/10/2017   5. Colon cancer screening - 01/18/17 with 5 year repeat planned 6. Skin cancer screening-  referred to Dr. 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 follows with [facility] derm now. advised regular sunscreen  use. Denies worrisome, changing, or new skin lesions.  7. Smoking associated screening (lung cancer screening, AAA screen 65-75, UA)- [place] smoker- cigarfree since 2017. No dip/chew recently. Check UA. AAA screen opts out. Below threshold for lung cancer screening 8. STD screening - only active with wife  Status of chronic or acute concerns   #hypertension-whitecoat element at times S: medication: amlodipine 10 mg daily , benazepril 40 mg daily, and Toprol XL 50 mg (chronic cough with generic) Home readings #s: not  recently checking BP Readings from Last 3 Encounters:  03/07/21 128/66  01/10/21 (!) 150/73  12/08/20 (!) 148/72  A/P: Blood pressure well controlled-continue current medication - some edema on amlodipine 10mg    # irregular heart rate S:he had noted this on his home BP cuff- getting alert for irregular heartbeat. On exam did have some occasional skips . Last ekg 2012 -Discussed with his desire to be more aggressive about health and some other mild changes on EKG (left axis), incomplete right bundle- will get cardiology consult -He saw cardiology on 01/10/2021-echocardiogram was ordered and thankfully was normal A/P: did have some PVCS but reassuring echo- no further follow up needed- he is going to cancel follow up cardiology appointment   #hyperlipidemia S: Medication:rosuvastatin 20 mg 3 times a week-increased last visit. No issues with that Lab Results  Component Value Date   CHOL 137 03/06/2021   HDL 40.90 03/06/2021   LDLCALC 72 03/06/2021   LDLDIRECT 112.0 03/01/2020   TRIG 120.0 03/06/2021   CHOLHDL 3 03/06/2021   A/P: Very close to ideal goal-we opted to continue to work on lifestyle this point     Recommended follow up: Return in about 6 months (around 09/04/2021) for follow up- or sooner if needed. Future Appointments  Date Time Provider [place]  04/12/2021  8:00 AM Branch, Royetta Crochet, MD [facility]   Lab/Order associations: No labs today    ICD-10-CM   1. Preventative health care  Z00.00     2. HYPERLIPIDEMIA  E78.2 POCT Urinalysis Dipstick (Automated)    3. Essential hypertension  I10 POCT Urinalysis Dipstick (Automated)    4. Irregular heart beat  I49.9     5. Elevated PSA  R97.20 PSA    6. Former smoker  Z87.891 POCT Urinalysis Dipstick (Automated)      No orders of the defined types were placed in this encounter.   I,Jada Bradford,acting as a scribe for Garret Reddish, MD.,have documented all relevant documentation on the behalf of Garret Reddish, MD,as directed by  Garret Reddish, MD while in the presence of Garret Reddish, MD.   I, Garret Reddish, MD, have reviewed all documentation for this visit. The documentation on 03/07/21 for the exam, diagnosis, procedures, and orders are all accurate and complete.   Return precautions advised.  Garret Reddish, MD

## 2021-03-07 ENCOUNTER — Encounter: Payer: Self-pay | Admitting: Family Medicine

## 2021-03-07 ENCOUNTER — Ambulatory Visit (INDEPENDENT_AMBULATORY_CARE_PROVIDER_SITE_OTHER): Payer: 59 | Admitting: Family Medicine

## 2021-03-07 VITALS — BP 128/66 | HR 43 | Temp 98.1°F | Ht 75.0 in | Wt 250.2 lb

## 2021-03-07 DIAGNOSIS — I10 Essential (primary) hypertension: Secondary | ICD-10-CM

## 2021-03-07 DIAGNOSIS — Z0000 Encounter for general adult medical examination without abnormal findings: Secondary | ICD-10-CM | POA: Diagnosis not present

## 2021-03-07 DIAGNOSIS — Z87891 Personal history of nicotine dependence: Secondary | ICD-10-CM

## 2021-03-07 DIAGNOSIS — Z23 Encounter for immunization: Secondary | ICD-10-CM | POA: Diagnosis not present

## 2021-03-07 DIAGNOSIS — I499 Cardiac arrhythmia, unspecified: Secondary | ICD-10-CM | POA: Diagnosis not present

## 2021-03-07 DIAGNOSIS — R972 Elevated prostate specific antigen [PSA]: Secondary | ICD-10-CM

## 2021-03-07 DIAGNOSIS — E782 Mixed hyperlipidemia: Secondary | ICD-10-CM

## 2021-03-07 LAB — POC URINALSYSI DIPSTICK (AUTOMATED)
Bilirubin, UA: NEGATIVE
Blood, UA: NEGATIVE
Glucose, UA: NEGATIVE
Ketones, UA: NEGATIVE
Leukocytes, UA: NEGATIVE
Nitrite, UA: NEGATIVE
Protein, UA: NEGATIVE
Spec Grav, UA: 1.025 (ref 1.010–1.025)
Urobilinogen, UA: 0.2 E.U./dL
pH, UA: 6 (ref 5.0–8.0)

## 2021-03-07 NOTE — Addendum Note (Signed)
Addended by: Clyde Lundborg A on: 03/07/2021 04:06 PM   Modules accepted: Orders

## 2021-03-07 NOTE — Patient Instructions (Addendum)
Health Maintenance Due  Topic Date Due   COVID-19 Vaccine (1)-will send dates via mychart Never done   Zoster Vaccines- Shingrix   Shingrix #1 today. Repeat injection in 2-6 months. Schedule a nurse visit for the 2nd injection before you leave today (at the check out desk)  Never done   Pneumonia Vaccine 63+ Years old (1 - PCV) - wants to hold off for now Never done   Schedule lab visit in 3-4 weeks with no ejaculation or vigorous exercise for 48 hours prior to psa  Please stop by lab before you go- urine only  If you have mychart- we will send your results within 3 business days of [place] receiving them.  If you do not have mychart- we will call you about results within 5 business days of [place] receiving them.  *please also note that you will see labs on mychart as soon as they post. I will later go in and write notes on them- will say "notes from Dr. Yong Channel"   Recommended follow up: Return in about 6 months (around 09/04/2021) for follow up- or sooner if needed.

## 2021-03-20 ENCOUNTER — Other Ambulatory Visit: Payer: Self-pay | Admitting: Family Medicine

## 2021-03-28 ENCOUNTER — Encounter: Payer: Self-pay | Admitting: Family Medicine

## 2021-03-28 ENCOUNTER — Other Ambulatory Visit: Payer: Self-pay

## 2021-03-28 ENCOUNTER — Other Ambulatory Visit (INDEPENDENT_AMBULATORY_CARE_PROVIDER_SITE_OTHER): Payer: 59

## 2021-03-28 DIAGNOSIS — R972 Elevated prostate specific antigen [PSA]: Secondary | ICD-10-CM | POA: Diagnosis not present

## 2021-03-28 LAB — PSA: PSA: 2.44 ng/mL (ref 0.10–4.00)

## 2021-04-08 ENCOUNTER — Other Ambulatory Visit: Payer: Self-pay | Admitting: Family Medicine

## 2021-04-12 ENCOUNTER — Ambulatory Visit: Payer: 59 | Admitting: Internal Medicine

## 2021-04-14 ENCOUNTER — Encounter: Payer: Self-pay | Admitting: Family Medicine

## 2021-04-14 NOTE — Telephone Encounter (Signed)
I have sent this to [place] to f/u on. ?

## 2021-05-29 ENCOUNTER — Encounter: Payer: Self-pay | Admitting: Family Medicine

## 2021-05-29 DIAGNOSIS — F4321 Adjustment disorder with depressed mood: Secondary | ICD-10-CM

## 2021-07-06 ENCOUNTER — Other Ambulatory Visit: Payer: Self-pay | Admitting: Family Medicine

## 2021-07-16 ENCOUNTER — Other Ambulatory Visit: Payer: Self-pay | Admitting: Family Medicine

## 2021-07-16 DIAGNOSIS — I10 Essential (primary) hypertension: Secondary | ICD-10-CM

## 2021-07-18 ENCOUNTER — Encounter: Payer: Self-pay | Admitting: Family Medicine

## 2021-07-31 ENCOUNTER — Encounter: Payer: Self-pay | Admitting: Family Medicine

## 2021-08-02 NOTE — Telephone Encounter (Signed)
Form completed and faxed back to pharmacy.

## 2021-08-07 ENCOUNTER — Other Ambulatory Visit: Payer: Self-pay

## 2021-08-07 MED ORDER — ROSUVASTATIN CALCIUM 20 MG PO TABS: 20.0000 mg | ORAL_TABLET | ORAL | 3 refills | Status: DC

## 2021-09-04 ENCOUNTER — Ambulatory Visit: Payer: 59 | Admitting: Family Medicine

## 2021-09-11 ENCOUNTER — Ambulatory Visit (INDEPENDENT_AMBULATORY_CARE_PROVIDER_SITE_OTHER): Payer: 59 | Admitting: Family Medicine

## 2021-09-11 ENCOUNTER — Encounter: Payer: Self-pay | Admitting: Family Medicine

## 2021-09-11 VITALS — BP 118/64 | HR 52 | Temp 97.9°F | Ht 75.0 in | Wt 248.0 lb

## 2021-09-11 DIAGNOSIS — J3489 Other specified disorders of nose and nasal sinuses: Secondary | ICD-10-CM | POA: Diagnosis not present

## 2021-09-11 DIAGNOSIS — I10 Essential (primary) hypertension: Secondary | ICD-10-CM | POA: Diagnosis not present

## 2021-09-11 DIAGNOSIS — E782 Mixed hyperlipidemia: Secondary | ICD-10-CM | POA: Diagnosis not present

## 2021-09-11 DIAGNOSIS — R972 Elevated prostate specific antigen [PSA]: Secondary | ICD-10-CM

## 2021-09-11 MED ORDER — AZITHROMYCIN 250 MG PO TABS: ORAL_TABLET | ORAL | 0 refills | Status: DC

## 2021-09-11 NOTE — Patient Instructions (Addendum)
Please stop by lab before you go If you have mychart- we will send your results within 3 business days of [place] receiving them.  If you do not have mychart- we will call you about results within 5 business days of [place] receiving them.  *please also note that you will see labs on mychart as soon as they post. I will later go in and write notes on them- will say "notes from Dr. Yong Channel"   Blood pressure looks great- continue current meds- we can tweak if needed if swelling becomes an issue   if has double sickening or lasts to 10 days would treat with antibiotic azithromycin - sent in today  Let us know if covid test becomes positive at home  Recommended follow up: Return in about 6 months (around 03/14/2022) for physical or sooner if needed.Schedule b4 you leave.

## 2021-09-11 NOTE — Progress Notes (Signed)
Phone [phone] In person visit   Subjective:   Bradley Cole. is a 66 y.o. year old very pleasant male patient who presents for/with See problem oriented charting Chief Complaint  Patient presents with   Follow-up   Hypertension    Pt c/o bilateral ankle swelling that goes and comes and he does not think he has hypertension anymore.   sinus drainage    Pt c/o sinus drainage that has been treated before but it is still lingering with dark nasal mucous.   Past Medical History-  Patient Active Problem List   Diagnosis Date Noted   History of adenomatous polyp of colon 02/01/2017    Priority: Medium    Essential hypertension 12/30/2007    Priority: Medium    HYPERLIPIDEMIA 11/19/2006    Priority: Medium    Tobacco use disorder 07/09/2015    Priority: Low   MVP (mitral valve prolapse) 07/08/2015    Priority: Low   DDD (degenerative disc disease)     Priority: Low   Hx of gout 12/30/2007    Priority: Low   Meniscal injury, left, initial encounter    History of skin cancer     Medications- reviewed and updated Current Outpatient Medications  Medication Sig Dispense Refill   amLODipine (NORVASC) 10 MG tablet Take 0.5 tablets (5 mg total) by mouth daily. 90 tablet 3   azithromycin (ZITHROMAX) 250 MG tablet Take 2 tabs on day 1, then 1 tab daily until finished now. Pick up now but only take if double sickening or sypmtoms last 10 days. 6 tablet 0   benazepril (LOTENSIN) 40 MG tablet TAKE 1 TABLET(40 MG) BY MOUTH DAILY 90 tablet 0   Multiple Vitamins-Minerals (MEGA MULTI MEN PO) Take by mouth daily.     rosuvastatin (CRESTOR) 20 MG tablet Take 1 tablet (20 mg total) by mouth 3 (three) times a week. 270 tablet 3   TOPROL XL 50 MG 24 hr tablet TAKE 1 TABLET BY MOUTH EVERY DAY WITH OR IMMEDIATELY FOLLOWING A MEAL 90 tablet 2   No current facility-administered medications for this visit.     Objective:  BP 118/64   Pulse (!) 52   Temp 97.9 F (36.6 C)   Ht '6\' 3"'$   (1.905 m)   Wt 248 lb (112.5 kg)   SpO2 97%   BMI 31.00 kg/m  Gen: NAD, resting comfortably Frontal sinus tenderness noted, nasal turbinates mildly erythematous and edematous, pharynx with signs of drainage CV: Bradycardic but regular no murmurs rubs or gallops Lungs: CTAB no crackles, wheeze, rhonchi Ext: trace edema Skin: warm, dry     Assessment and Plan   # Sinus drainage S:grandson has been ill. Going to [place] hole on Saturday and will be outside-  started his zicam. On Sunday started with sinus drainage and pressure with some dark nasal mucus  -since sinus infection last year wakes up every morning and has dry mouth and coughs up some discolored sputum- but once he gets that up he is ok- dry mouth doesn't linger after that A/P:  day 2 of illness today- if has double sickening or lasts to 10 days would treat with antibiotic azithromycin-since he was traveling out of town and may be difficult to medicate request to have on hand-prescription for azithromycin was written and went over thoroughly indications for use - also agrees to home test for covid (did not have time today to wait for testing) and discussed would use antiviral if positive  #hypertension S: medication:  amlodipine 10 mg (does get edema that comes and goes on this), benazepril '40mg'$  Home readings #s: not checking BP Readings from Last 3 Encounters:  09/11/21 118/64  03/07/21 128/66  01/10/21 (!) 150/73  A/P: Controlled. Continue current medications.  - does have some edema- could reduce amlodipine and add diuretic - he prefers ot monitor unless worsens further  #hyperlipidemia S: Medication: atorvastatin 20 mg 3x a week Lab Results  Component Value Date   CHOL 137 03/06/2021   HDL 40.90 03/06/2021   LDLCALC 72 03/06/2021   LDLDIRECT 112.0 03/01/2020   TRIG 120.0 03/06/2021   CHOLHDL 3 03/06/2021   A/P: Close to ideal control-continue work on healthy eating/regular exercise  # elevated PSA S: Saw  urology in April and they did not repeat PSA since was trending down Lab Results  Component Value Date   PSA 2.44 03/28/2021   PSA 3.67 03/06/2021   PSA 1.06 03/01/2020  A/P: Urology mention 82-monthrepeat on PSA but he is not scheduled for this and we opted to go ahead and check PSA today-unfortunately I was running behind and he will need to come back for this as he has another engagement plan  Recommended follow up: Return in about 6 months (around 03/14/2022) for physical or sooner if needed.Schedule b4 you leave.  Lab/Order associations:   ICD-10-CM   1. Essential hypertension  I10     2. HYPERLIPIDEMIA  E78.2     3. Elevated PSA  R97.20 PSA    CANCELED: PSA    4. Sinus pressure  J34.89       Meds ordered this encounter  Medications   azithromycin (ZITHROMAX) 250 MG tablet    Sig: Take 2 tabs on day 1, then 1 tab daily until finished now. Pick up now but only take if double sickening or sypmtoms last 10 days.    Dispense:  6 tablet    Refill:  0   Return precautions advised.  SGarret Reddish MD

## 2021-09-12 ENCOUNTER — Encounter: Payer: Self-pay | Admitting: Family Medicine

## 2021-09-24 ENCOUNTER — Encounter: Payer: Self-pay | Admitting: Family Medicine

## 2021-09-25 ENCOUNTER — Encounter: Payer: Self-pay | Admitting: Family Medicine

## 2021-09-26 NOTE — Telephone Encounter (Signed)
Patient called and I informed of message below.   Dr. Yong Channel does not have anything available until Friday, 08/25, is this okay or should patient's be worked in? If so, when and where? Please Advise.

## 2021-09-27 ENCOUNTER — Encounter: Payer: Self-pay | Admitting: Family Medicine

## 2021-09-27 ENCOUNTER — Telehealth (INDEPENDENT_AMBULATORY_CARE_PROVIDER_SITE_OTHER): Payer: 59 | Admitting: Family Medicine

## 2021-09-27 VITALS — Ht 75.0 in | Wt 240.0 lb

## 2021-09-27 DIAGNOSIS — U071 COVID-19: Secondary | ICD-10-CM | POA: Diagnosis not present

## 2021-09-27 NOTE — Progress Notes (Signed)
Phone [phone] Virtual visit via Video note   Subjective:  Chief complaint: Chief Complaint  Patient presents with   Covid Positive    Pt tested positive on Sunday and re tested this morning and was still positive. His symptoms are lesseing (congestion, coughing and mucous with draining) he is not as tired. Pt denies fever but he has been waking up in pools of sweat.    This visit type was conducted due to national recommendations for restrictions regarding the COVID-19 Pandemic (e.g. social distancing).  This format is felt to be most appropriate for this patient at this time balancing risks to patient and risks to population by having him in for in person visit.  No physical exam was performed (except for noted visual exam or audio findings with Telehealth visits).    Our team/I connected with [name] at 11:20 AM EDT by a video enabled telemedicine application (doxy.me or caregility through epic) and verified that I am speaking with the correct person using two identifiers.  Location patient: [place] Location provider: [facility], office Persons participating in the virtual visit:  patient  Our team/I discussed the limitations of evaluation and management by telemedicine and the availability of in person appointments. In light of current covid-19 pandemic, patient also understands that we are trying to protect them by minimizing in office contact if at all possible.  The patient expressed consent for telemedicine visit and agreed to proceed. Patient understands insurance will be billed.   Past Medical History-  Patient Active Problem List   Diagnosis Date Noted   History of adenomatous polyp of colon 02/01/2017    Priority: Medium    Essential hypertension 12/30/2007    Priority: Medium    HYPERLIPIDEMIA 11/19/2006    Priority: Medium    Tobacco use disorder 07/09/2015    Priority: Low   MVP (mitral valve prolapse) 07/08/2015    Priority: Low   DDD  (degenerative disc disease)     Priority: Low   Hx of gout 12/30/2007    Priority: Low   Meniscal injury, left, initial encounter    History of skin cancer     Medications- reviewed and updated Current Outpatient Medications  Medication Sig Dispense Refill   amLODipine (NORVASC) 10 MG tablet Take 0.5 tablets (5 mg total) by mouth daily. 90 tablet 3   benazepril (LOTENSIN) 40 MG tablet TAKE 1 TABLET(40 MG) BY MOUTH DAILY 90 tablet 0   Multiple Vitamins-Minerals (MEGA MULTI MEN PO) Take by mouth daily.     rosuvastatin (CRESTOR) 20 MG tablet Take 1 tablet (20 mg total) by mouth 3 (three) times a week. 270 tablet 3   TOPROL XL 50 MG 24 hr tablet TAKE 1 TABLET BY MOUTH EVERY DAY WITH OR IMMEDIATELY FOLLOWING A MEAL 90 tablet 2   No current facility-administered medications for this visit.     Objective:  Ht '6\' 3"'$  (1.905 m)   Wt 240 lb (108.9 kg)   BMI 30.00 kg/m  self reported vitals Gen: NAD, resting comfortably Lungs: nonlabored, normal respiratory rate  Skin: appears dry, no obvious rash     Assessment and Plan   # Covid 19 S:patient tested positive for covid on Sunday. Went to [facility] and had done much better on antibiotics (from early august visit when tested negative for covid after visit)but had congestion/cough and was breaking up- when he got back on Sunday of this week started having recurrence of symptoms- worening sinus congestion. No shortness of breath  but perhaps mild discomfort with deep breaths- doing better now. Had low energy but feels better now. Feels he is improving  A/P: Patient with testing confirming covid 19 with first day of covid 19 symptoms on Saturday during the day Vaccination status: has opted out of covid vaccines- last had covid may 2022  Therefore: - recommended patient watch closely for shortness of breath or confusion or worsening symptoms and if those occur patient should contact us immediately or seek care in the emergency  department -recommended patient consider purchasing pulse oximeter and if levels 94% or below persistently- seek care at the hospital - Patient needs to self isolate  for at least 5 days since first symptom AND at least 24 hours fever free without fever reducing medications AND have improvement in respiratory symptoms . After 5 days can end self isolation but still needs to wear mask for additional 5 days .  -Patient should inform close contacts about exposure (anyone patient been around unmasked for more than 15 minutes)   If High risk for complications-we discussed outpatient therapeutic options including paxlovid (and risk of rebound), molnupiravir - patient opted out as he prefers to remain off meds as long as improving. His main goal for discussion today was when could safely return to work  Recommended follow up: as needed if symptoms fail to continue to improve Future Appointments  Date Time Provider [place]  10/03/2021  8:30 AM [facility]    Lab/Order associations:   ICD-10-CM   1. COVID-19  U07.1      Return precautions advised.  Garret Reddish, MD

## 2021-10-03 ENCOUNTER — Other Ambulatory Visit: Payer: 59

## 2021-10-04 ENCOUNTER — Encounter: Payer: Self-pay | Admitting: Family Medicine

## 2021-10-04 ENCOUNTER — Other Ambulatory Visit (INDEPENDENT_AMBULATORY_CARE_PROVIDER_SITE_OTHER): Payer: 59

## 2021-10-04 DIAGNOSIS — R972 Elevated prostate specific antigen [PSA]: Secondary | ICD-10-CM

## 2021-10-04 LAB — PSA: PSA: 1.09 ng/mL (ref 0.10–4.00)

## 2021-10-08 ENCOUNTER — Other Ambulatory Visit: Payer: Self-pay | Admitting: Family Medicine

## 2021-10-18 ENCOUNTER — Other Ambulatory Visit: Payer: Self-pay | Admitting: Internal Medicine

## 2022-01-12 ENCOUNTER — Other Ambulatory Visit: Payer: Self-pay | Admitting: Family Medicine

## 2022-01-19 ENCOUNTER — Other Ambulatory Visit: Payer: Self-pay | Admitting: Family Medicine

## 2022-01-22 ENCOUNTER — Other Ambulatory Visit: Payer: Self-pay | Admitting: Family Medicine

## 2022-01-22 DIAGNOSIS — I10 Essential (primary) hypertension: Secondary | ICD-10-CM

## 2022-02-06 ENCOUNTER — Encounter: Payer: Self-pay | Admitting: Family Medicine

## 2022-02-13 ENCOUNTER — Encounter: Payer: Self-pay | Admitting: Family Medicine

## 2022-02-13 NOTE — Telephone Encounter (Signed)
I called the number below and hold times were long, will try again later.

## 2022-05-03 ENCOUNTER — Other Ambulatory Visit: Payer: Self-pay | Admitting: Internal Medicine

## 2022-05-21 ENCOUNTER — Encounter: Payer: Self-pay | Admitting: Family Medicine

## 2022-06-08 ENCOUNTER — Ambulatory Visit (INDEPENDENT_AMBULATORY_CARE_PROVIDER_SITE_OTHER): Payer: Medicare HMO | Admitting: Family Medicine

## 2022-06-08 ENCOUNTER — Encounter: Payer: Self-pay | Admitting: Family Medicine

## 2022-06-08 VITALS — BP 148/70 | HR 61 | Temp 98.2°F | Ht 75.0 in | Wt 246.2 lb

## 2022-06-08 DIAGNOSIS — Z125 Encounter for screening for malignant neoplasm of prostate: Secondary | ICD-10-CM | POA: Diagnosis not present

## 2022-06-08 DIAGNOSIS — E782 Mixed hyperlipidemia: Secondary | ICD-10-CM

## 2022-06-08 DIAGNOSIS — M5441 Lumbago with sciatica, right side: Secondary | ICD-10-CM

## 2022-06-08 DIAGNOSIS — M5442 Lumbago with sciatica, left side: Secondary | ICD-10-CM

## 2022-06-08 DIAGNOSIS — I10 Essential (primary) hypertension: Secondary | ICD-10-CM

## 2022-06-08 DIAGNOSIS — M10072 Idiopathic gout, left ankle and foot: Secondary | ICD-10-CM | POA: Diagnosis not present

## 2022-06-08 DIAGNOSIS — G8929 Other chronic pain: Secondary | ICD-10-CM | POA: Diagnosis not present

## 2022-06-08 DIAGNOSIS — R202 Paresthesia of skin: Secondary | ICD-10-CM

## 2022-06-08 DIAGNOSIS — R739 Hyperglycemia, unspecified: Secondary | ICD-10-CM | POA: Diagnosis not present

## 2022-06-08 DIAGNOSIS — Z131 Encounter for screening for diabetes mellitus: Secondary | ICD-10-CM | POA: Diagnosis not present

## 2022-06-08 LAB — COMPREHENSIVE METABOLIC PANEL
ALT: 28 U/L (ref 0–53)
AST: 22 U/L (ref 0–37)
Albumin: 4.4 g/dL (ref 3.5–5.2)
Alkaline Phosphatase: 81 U/L (ref 39–117)
BUN: 13 mg/dL (ref 6–23)
CO2: 26 mEq/L (ref 19–32)
Calcium: 9.3 mg/dL (ref 8.4–10.5)
Chloride: 103 mEq/L (ref 96–112)
Creatinine, Ser: 0.98 mg/dL (ref 0.40–1.50)
GFR: 79.92 mL/min (ref >60.00)
Glucose, Bld: 85 mg/dL (ref 70–99)
Potassium: 4 mEq/L (ref 3.5–5.1)
Sodium: 139 mEq/L (ref 135–145)
Total Bilirubin: 0.7 mg/dL (ref 0.2–1.2)
Total Protein: 7.5 g/dL (ref 6.0–8.3)

## 2022-06-08 LAB — LIPID PANEL
Cholesterol: 127 mg/dL (ref 0–200)
HDL: 38.4 mg/dL — ABNORMAL LOW (ref >39.00)
LDL Cholesterol: 58 mg/dL (ref 0–99)
NonHDL: 88.18
Total CHOL/HDL Ratio: 3
Triglycerides: 151 mg/dL — ABNORMAL HIGH (ref 0.0–149.0)
VLDL: 30.2 mg/dL (ref 0.0–40.0)

## 2022-06-08 LAB — CBC WITH DIFFERENTIAL/PLATELET
Basophils Absolute: 0 10*3/uL (ref 0.0–0.1)
Basophils Relative: 0.3 % (ref 0.0–3.0)
Eosinophils Absolute: 0.1 10*3/uL (ref 0.0–0.7)
Eosinophils Relative: 1.6 % (ref 0.0–5.0)
HCT: 40 % (ref 39.0–52.0)
Hemoglobin: 13.9 g/dL (ref 13.0–17.0)
Lymphocytes Relative: 31 % (ref 12.0–46.0)
Lymphs Abs: 2.1 10*3/uL (ref 0.7–4.0)
MCHC: 34.7 g/dL (ref 30.0–36.0)
MCV: 85.7 fl (ref 78.0–100.0)
Monocytes Absolute: 0.6 10*3/uL (ref 0.1–1.0)
Monocytes Relative: 9.2 % (ref 3.0–12.0)
Neutro Abs: 3.9 10*3/uL (ref 1.4–7.7)
Neutrophils Relative %: 57.9 % (ref 43.0–77.0)
Platelets: 290 10*3/uL (ref 150.0–400.0)
RBC: 4.66 Mil/uL (ref 4.22–5.81)
RDW: 12.9 % (ref 11.5–15.5)
WBC: 6.7 10*3/uL (ref 4.0–10.5)

## 2022-06-08 LAB — URINALYSIS, ROUTINE W REFLEX MICROSCOPIC
Bilirubin Urine: NEGATIVE
Hgb urine dipstick: NEGATIVE
Ketones, ur: NEGATIVE
Leukocytes,Ua: NEGATIVE
Nitrite: NEGATIVE
Specific Gravity, Urine: 1.025 (ref 1.000–1.030)
Total Protein, Urine: NEGATIVE
Urine Glucose: NEGATIVE
Urobilinogen, UA: 0.2 (ref 0.0–1.0)
pH: 6 (ref 5.0–8.0)

## 2022-06-08 LAB — PSA, MEDICARE: PSA: 1.37 ng/ml (ref 0.10–4.00)

## 2022-06-08 LAB — HEMOGLOBIN A1C: Hgb A1c MFr Bld: 5.4 % (ref 4.6–6.5)

## 2022-06-08 LAB — VITAMIN B12: Vitamin B-12: 363 pg/mL (ref 211–911)

## 2022-06-08 LAB — URIC ACID: Uric Acid, Serum: 6.3 mg/dL (ref 4.0–7.8)

## 2022-06-08 LAB — TSH: TSH: 1.25 u[IU]/mL (ref 0.35–5.50)

## 2022-06-08 NOTE — Patient Instructions (Addendum)
[facility] contact- you can schedule for the fall Please call to schedule visit and/or procedure Address: [address] [place], [place], [place] [address] Phone: [phone]   We have placed a referral for you today to [facility] orthopedics Dr. Jerl Santos. You should receive a mychart message or phone call within a week with the # to call.   We are basically ruling out any medical causes of the numbness/tingling but strongly suspect this could be from your back.   Lets work on [employer] loss with dash eating plan and caloric reduction and recheck 1-2 months on blood presure  Please stop by lab before you go If you have mychart- we will send your results within 3 business days of [place] receiving them.  If you do not have mychart- we will call you about results within 5 business days of [place] receiving them.  *please also note that you will see labs on mychart as soon as they post. I will later go in and write notes on them- will say "notes from Dr. Durene Cal"

## 2022-06-08 NOTE — Progress Notes (Signed)
Phone [phone] In person visit   Subjective:   Bradley Borth. is a 67 y.o. year old very pleasant male patient who presents for/with See problem oriented charting Chief Complaint  Patient presents with   Foot Swelling    Pt c/o bilateral feet swelling and burning with pins and needle sensation, its not constant but come and goes and noticed this when BP med increase   Gout    Pt c/o gout in left foot that flared up 2 weeks a go and went away but joint in big toe still hurts.   hip pressure    Pt c/o pressure in right hip pressure and pain   Past Medical History-  Patient Active Problem List   Diagnosis Date Noted   History of adenomatous polyp of colon 02/01/2017    Priority: Medium    Essential hypertension 12/30/2007    Priority: Medium    HYPERLIPIDEMIA 11/19/2006    Priority: Medium    Tobacco use disorder 07/09/2015    Priority: Low   MVP (mitral valve prolapse) 07/08/2015    Priority: Low   DDD (degenerative disc disease)     Priority: Low   Gout 12/30/2007    Priority: Low   Meniscal injury, left, initial encounter    History of skin cancer     Medications- reviewed and updated Current Outpatient Medications  Medication Sig Dispense Refill   amLODipine (NORVASC) 10 MG tablet TAKE 1 TABLET BY MOUTH DAILY 30 tablet 2   benazepril (LOTENSIN) 40 MG tablet TAKE 1 TABLET(40 MG) BY MOUTH DAILY 90 tablet 0   Multiple Vitamins-Minerals (MEGA MULTI MEN PO) Take by mouth daily.     rosuvastatin (CRESTOR) 20 MG tablet Take 1 tablet (20 mg total) by mouth 3 (three) times a week. 270 tablet 3   TOPROL XL 50 MG 24 hr tablet TAKE 1 TABLET BY MOUTH EVERY DAY WITH OR IMMEDIATELY FOLLOWING A MEAL 90 tablet 2   No current facility-administered medications for this visit.     Objective:  BP (!) 148/70   Pulse 61   Temp 98.2 F (36.8 C)   Ht 6\' 3"  (1.905 m)   Wt 246 lb 3.2 oz (111.7 kg)   SpO2 97%   BMI 30.77 kg/m  Gen: NAD, resting comfortably CV: RRR no  murmurs rubs or gallops Lungs: CTAB no crackles, wheeze, rhonchi Ext: trace to1+ edema Skin: warm, dry, multiple yellowed toenails involving various portions of the nail on both feet  Back - Normal skin, Spine with normal alignment and no deformity.  No tenderness to vertebral process palpation.  Paraspinous muscles are not tender and without spasm.   Range of motion is full at neck and lumbar sacral regions. Negative Straight leg raise.  Neuro- no saddle anesthesia, 5/5 strength lower extremities -Some pain over the right greater trochanteric bursa - No pain over the bilateral great toes including empty PA and IP joint    Assessment and Plan   # Bilateral paresthesias S: started with swelling in his feet after starting amlodipine at 10 mg back in 2022.  On the other hand he did not have paresthesias until December/January when his back went out.  Reports herniated disc about 20 years ago and had to see neurology around that time and reports known history of degenerative disc disease  Around January 2024-noted right lateral hip and back pain noted as severe and worked through that but then the noted burning and tingling in the feet that started  with more persistent. Even at the present moment feels like he is standing on 2 heating pads for instance.  Burning pain in feet can range anywhere from 2-6 out of 10  Also of note is having to favor his left knee due to right knee pain and wonders if walking differently could have triggered back pain.   Worried about diabtes due to family history and potential for neuropathy A/P: Patient with bilateral paresthesias in his feet and we opted to check TSH, B12 and A1c to evaluate possible medical causes-we also discussed possible orthopedic cause of this especially with paresthesias substantially worsening after tweaking his back earlier this year - Has seen Dr. Jerl Santos in the past for his knee and he would like to see him back especially since he feels  the other issues may have been generated by walking differently - Offered to do x-ray of the lumbar spine but he wants to wait and discuss with orthopedics-updated x-rays of the lumbar spine and knees could be obtained with orthopedics -In addition due to family history and BMI over 30 check a1c in light of possible neuropathy issues -Also with some pain over the right greater trochanter-perhaps mild trochanteric bursitis-PT may be beneficial through physical therapy.  Also consider prednisone to help with the back and may have some indirect benefit but we wanted to get labs first particular rule out diabetes and make sure we do not raise his blood sugar too high -At beginning of visit I thought that the edema that started after amlodipine could have contributed to symptoms but that was started in 2022 and his symptoms were substantially worsened in January 2024  #Gout S: Medication: none other than as needed: tart cherry juice Patient with history of gout with onset around 2011 and previously only had about 2 episodes listed-typically with tart cherry juice has been helpful.  As of last visit-with no recent flares we had not been monitoring uric acid levels   He will also flare in gout in his left great toe about 2 weeks ago-swelling/redness seem to improved with tart cherry juice but joint and the toe still hurts as of yesterday but much better today Lab Results  Component Value Date   LABURIC 6.3 06/08/2022  A/P: It appears recent gout flare has resolved-no pain on exam today of the great toe at the MTP or IP joint.  Uric acid level today is minimally elevated-we could consider in the long run treating with allopurinol but he would like to focus on orthopedic workup and improving blood pressure right now   #hypertension S: medication: Amlodipine 10 mg since 2022, benazepril 40 mg, metoprolol 50 mg XR Home readings #s: 140/70s-80's -exercise down to 3x a week from pain BP Readings from Last 3  Encounters:  06/08/22 (!) 148/70  09/11/21 118/64  03/07/21 128/66  A/P: Blood pressure has been previously well-controlled but is elevated today-has also been running higher at home-I wonder if pain could contribute-before starting additional medicine he wants to work on healthy eating/exercise/weight loss and we can reevaluate at next visit.  Close follow-up in 2 months plan  #hyperlipidemia S: Medication: rosuvastatin 40 mg 3x a week Lab Results  Component Value Date   CHOL 127 06/08/2022   HDL 38.40 (L) 06/08/2022   LDLCALC 58 06/08/2022   LDLDIRECT 112.0 03/01/2020   TRIG 151.0 (H) 06/08/2022   CHOLHDL 3 06/08/2022   A/P: LDL looks excellent on labs today at 58 which is well under goal of 70 or  less-continue current medicine  # Onychomycosis multiple toenails-Terbinafine trial when things stablized-she will reach out once we have blood pressure and paresthesia issues better understood  # Also due for annual labs and will update PSA    Recommended follow up: Return in about 2 months (around 08/08/2022) for followup or sooner if needed.Schedule b4 you leave. Future Appointments  Date Time Provider Department Center  10/05/2022  3:00 PM Shelva Majestic, MD [facility]    Lab/Order associations:   ICD-10-CM   1. Paresthesia  R20.2 Vitamin B12    TSH    Ambulatory referral to Orthopedic Surgery    2. Essential hypertension  I10 Comprehensive metabolic panel    CBC with Differential/Platelet    Lipid panel    Urinalysis, Routine w reflex microscopic    3. Acute idiopathic gout involving toe of left foot  M10.072 Uric acid    4. HYPERLIPIDEMIA  E78.2     5. Hyperglycemia  R73.9 HgB A1c    6. Screening for diabetes mellitus  Z13.1 HgB A1c    7. Screening for prostate cancer  Z12.5 PSA, Medicare ( [facility] only)    8. Chronic right-sided low back pain with bilateral sciatica  M54.41 Ambulatory referral to Orthopedic Surgery   M54.42    G89.29      Return  precautions advised.  Tana Conch, MD

## 2022-06-18 DIAGNOSIS — H524 Presbyopia: Secondary | ICD-10-CM | POA: Diagnosis not present

## 2022-06-27 ENCOUNTER — Encounter: Payer: Self-pay | Admitting: Family Medicine

## 2022-07-04 DIAGNOSIS — H43812 Vitreous degeneration, left eye: Secondary | ICD-10-CM | POA: Diagnosis not present

## 2022-07-04 DIAGNOSIS — H47323 Drusen of optic disc, bilateral: Secondary | ICD-10-CM | POA: Diagnosis not present

## 2022-07-04 DIAGNOSIS — H43821 Vitreomacular adhesion, right eye: Secondary | ICD-10-CM | POA: Diagnosis not present

## 2022-07-04 DIAGNOSIS — D3131 Benign neoplasm of right choroid: Secondary | ICD-10-CM | POA: Diagnosis not present

## 2022-07-13 DIAGNOSIS — M1990 Unspecified osteoarthritis, unspecified site: Secondary | ICD-10-CM | POA: Diagnosis not present

## 2022-07-23 ENCOUNTER — Encounter: Payer: Self-pay | Admitting: Family Medicine

## 2022-07-23 ENCOUNTER — Ambulatory Visit (INDEPENDENT_AMBULATORY_CARE_PROVIDER_SITE_OTHER): Payer: Medicare HMO | Admitting: Family Medicine

## 2022-07-23 VITALS — BP 138/74 | HR 61 | Temp 98.2°F | Ht 75.0 in | Wt 242.6 lb

## 2022-07-23 DIAGNOSIS — J208 Acute bronchitis due to other specified organisms: Secondary | ICD-10-CM | POA: Diagnosis not present

## 2022-07-23 DIAGNOSIS — B9689 Other specified bacterial agents as the cause of diseases classified elsewhere: Secondary | ICD-10-CM

## 2022-07-23 MED ORDER — AZITHROMYCIN 250 MG PO TABS: ORAL_TABLET | ORAL | 0 refills | Status: DC

## 2022-07-23 NOTE — Telephone Encounter (Signed)
LVM to schedule a visit with any provider. Allergic to penicillin.

## 2022-07-23 NOTE — Patient Instructions (Signed)
Please follow up if symptoms do not improve or as needed.   

## 2022-07-23 NOTE — Progress Notes (Signed)
Subjective  CC:  Chief Complaint  Patient presents with   Cough    Pt stated that he has had a cough with some drainage since 07/19/2022 and now it has turned brownish/green. Pt has taken 2 COVID test(negative)    HPI: SUBJECTIVE:  Bradley Cole. is a 67 y.o. male who complains of congestion, nasal blockage, post nasal drip, cough described as productive of yellow and green sputum and denies sinus, high fevers, SOB, chest pain or significant GI symptoms. Symptoms have been present for 4-5 days. Wife with same and on antibiotics. Not taking otc meds.. He denies a history of anorexia, dizziness, vomiting and wheezing. He denies a history of asthma or COPD. Patient does not smoke cigarettes.  Assessment  1. Acute bacterial bronchitis      Plan  Discussion:  Discussed viral vs bacterial. Will Treat for bacterial bronchitis if worsening symptoms or prolonged. Pt will only take Zpak IF needed. Start mucinex dm. . Education regarding differences between viral and bacterial infections and treatment options are discussed.  Supportive care measures are recommended.  We discussed the use of mucolytic's, antihistamines and antitussives as needed.  Tylenol or Advil are recommended if needed.  Follow up: prn   No orders of the defined types were placed in this encounter.  No orders of the defined types were placed in this encounter.     I reviewed the patients updated PMH, FH, and SocHx.  Social History: Patient  reports that he quit smoking about 11 years ago. His smoking use included cigars. He has quit using smokeless tobacco.  His smokeless tobacco use included chew. He reports current alcohol use of about 3.0 standard drinks of alcohol per week. He reports that he does not use drugs.  Patient Active Problem List   Diagnosis Date Noted   Meniscal injury, left, initial encounter    History of skin cancer    History of adenomatous polyp of colon 02/01/2017   Tobacco use disorder  07/09/2015   MVP (mitral valve prolapse) 07/08/2015   DDD (degenerative disc disease)    Gout 12/30/2007   Essential hypertension 12/30/2007   HYPERLIPIDEMIA 11/19/2006    Review of Systems: Cardiovascular: negative for chest pain Respiratory: negative for SOB or hemoptysis Gastrointestinal: negative for abdominal pain Genitourinary: negative for dysuria or gross hematuria Current Meds  Medication Sig   amLODipine (NORVASC) 10 MG tablet TAKE 1 TABLET BY MOUTH DAILY   benazepril (LOTENSIN) 40 MG tablet TAKE 1 TABLET(40 MG) BY MOUTH DAILY   Multiple Vitamins-Minerals (MEGA MULTI MEN PO) Take by mouth daily.   rosuvastatin (CRESTOR) 20 MG tablet Take 1 tablet (20 mg total) by mouth 3 (three) times a week.   TOPROL XL 50 MG 24 hr tablet TAKE 1 TABLET BY MOUTH EVERY DAY WITH OR IMMEDIATELY FOLLOWING A MEAL    Objective  Vitals: Temp 98.2 F (36.8 C)   Ht 6\' 3"  (1.905 m)   Wt 242 lb 9.6 oz (110 kg)   BMI 30.32 kg/m  General: no acute distress  Psych:  Alert and oriented, normal mood and affect HEENT:  Normocephalic, atraumatic, supple neck, moist mucous membranes, clear pharynx without exudate, mild lymphadenopathy, supple neck Cardiovascular:  RRR without murmur. no edema Respiratory:  Good breath sounds bilaterally, CTAB with normal respiratory effort with occasional rhonchi   Commons side effects, risks, benefits, and alternatives for medications and treatment plan prescribed today were discussed, and the patient expressed understanding of the given instructions. Patient is  instructed to call or message via MyChart if he/she has any questions or concerns regarding our treatment plan. No barriers to understanding were identified. We discussed Red Flag symptoms and signs in detail. Patient expressed understanding regarding what to do in case of urgent or emergency type symptoms.  Medication list was reconciled, printed and provided to the patient in AVS. Patient instructions and  summary information was reviewed with the patient as documented in the AVS. This note was prepared with assistance of Dragon voice recognition software. Occasional wrong-word or sound-a-like substitutions may have occurred due to the inherent limitations of voice recognition software

## 2022-07-30 ENCOUNTER — Telehealth: Payer: Self-pay | Admitting: Family Medicine

## 2022-07-30 NOTE — Telephone Encounter (Signed)
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 Can do it later in day on 6/28 but unless you tell me see you 8/1. Everything is doing ok . [facility]

## 2022-07-31 NOTE — Telephone Encounter (Signed)
FYI

## 2022-07-31 NOTE — Telephone Encounter (Signed)
Can you all see what pt is referring to please regarding this appt scheduling?

## 2022-07-31 NOTE — Telephone Encounter (Signed)
Pt was previously scheduled for 08/03/22 but rescheduled for 09/06/22. Stated if Bradley Cole needed him to come in on 08/03/22, it would have to be late in the day. If not, then he would be in on 09/06/22.

## 2022-07-31 NOTE — Telephone Encounter (Signed)
Can you provide more context?  I do not recall full circumstances here

## 2022-08-01 ENCOUNTER — Other Ambulatory Visit: Payer: Self-pay

## 2022-08-01 ENCOUNTER — Encounter: Payer: Self-pay | Admitting: Family Medicine

## 2022-08-01 MED ORDER — BENAZEPRIL HCL 40 MG PO TABS: ORAL_TABLET | ORAL | 3 refills | Status: DC

## 2022-08-01 NOTE — Telephone Encounter (Signed)
August should be fine-especially since blood pressure looked better at visit with Dr. Mardelle Matte

## 2022-08-03 ENCOUNTER — Other Ambulatory Visit: Payer: Self-pay | Admitting: Internal Medicine

## 2022-08-03 ENCOUNTER — Ambulatory Visit: Payer: Medicare HMO | Admitting: Family Medicine

## 2022-08-17 ENCOUNTER — Other Ambulatory Visit: Payer: Self-pay | Admitting: Family Medicine

## 2022-09-05 ENCOUNTER — Encounter (INDEPENDENT_AMBULATORY_CARE_PROVIDER_SITE_OTHER): Payer: Self-pay

## 2022-09-06 ENCOUNTER — Other Ambulatory Visit: Payer: Self-pay | Admitting: Family Medicine

## 2022-09-06 ENCOUNTER — Ambulatory Visit: Payer: Medicare HMO | Admitting: Family Medicine

## 2022-09-13 ENCOUNTER — Encounter: Payer: Self-pay | Admitting: Family Medicine

## 2022-09-13 ENCOUNTER — Ambulatory Visit (INDEPENDENT_AMBULATORY_CARE_PROVIDER_SITE_OTHER): Payer: Medicare HMO | Admitting: Family Medicine

## 2022-09-13 VITALS — BP 128/70 | HR 54 | Temp 98.5°F | Ht 75.0 in | Wt 242.8 lb

## 2022-09-13 DIAGNOSIS — E538 Deficiency of other specified B group vitamins: Secondary | ICD-10-CM | POA: Diagnosis not present

## 2022-09-13 DIAGNOSIS — Z8709 Personal history of other diseases of the respiratory system: Secondary | ICD-10-CM

## 2022-09-13 DIAGNOSIS — I10 Essential (primary) hypertension: Secondary | ICD-10-CM

## 2022-09-13 NOTE — Assessment & Plan Note (Signed)
#   B12 deficiency presented with paresthesias with levels in 300 S: Current treatment/medication (oral vs. IM): B12 weekly on monday  -tingling resolved on B12, occasional mild numbness in a toe like swelling sensation A/P: doing much better on B12- will continue weekly treatments

## 2022-09-13 NOTE — Patient Instructions (Addendum)
[facility] contact Please call to schedule visit and/or procedure Address: [address] [place], [place], [place] [address] Phone: [phone]   Let us know if you get a flu or COVID vaccine this fall.  I suggest myfitnesspal for at least a month Use 0.5-1  pounds per week weight loss goal Set a reasonable goal such as 5-10 lbs . In 230s would be great by 6 months.  Do not connect your step counter to this- watch or phone Try to do myfitnesspal for 1 month and update me   Recommended follow up: Return in about 6 months (around 03/16/2023) for physical or sooner if needed.Schedule b4 you leave.  -cancel visit at end of august

## 2022-09-13 NOTE — Progress Notes (Signed)
Phone [phone] In person visit   Subjective:   Bradley Cole. is a 67 y.o. year old very pleasant male patient who presents for/with See problem oriented charting Chief Complaint  Patient presents with   2 month f/u    Pt is here for 2 month f/u on bronchitis.   Past Medical History-  Patient Active Problem List   Diagnosis Date Noted   B12 deficiency 09/13/2022    Priority: Medium    History of adenomatous polyp of colon 02/01/2017    Priority: Medium    Essential hypertension 12/30/2007    Priority: Medium    HYPERLIPIDEMIA 11/19/2006    Priority: Medium    Tobacco use disorder 07/09/2015    Priority: Low   MVP (mitral valve prolapse) 07/08/2015    Priority: Low   DDD (degenerative disc disease)     Priority: Low   Gout 12/30/2007    Priority: Low   Meniscal injury, left, initial encounter    History of skin cancer     Medications- reviewed and updated Current Outpatient Medications  Medication Sig Dispense Refill   amLODipine (NORVASC) 10 MG tablet TAKE 1 TABLET BY MOUTH DAILY 30 tablet 2   benazepril (LOTENSIN) 40 MG tablet TAKE 1 TABLET(40 MG) BY MOUTH DAILY 90 tablet 3   Multiple Vitamins-Minerals (MEGA MULTI MEN PO) Take by mouth daily.     rosuvastatin (CRESTOR) 20 MG tablet TAKE 1 TABLET BY MOUTH 3 TIMES A WEEK. 270 tablet 3   TOPROL XL 50 MG 24 hr tablet TAKE 1 TABLET BY MOUTH EVERY DAY WITH OR IMMEDIATELY FOLLOWING A MEAL 90 tablet 2   No current facility-administered medications for this visit.     Objective:  BP 128/70   Pulse (!) 54   Temp 98.5 F (36.9 C)   Ht 6\' 3"  (1.905 m)   Wt 242 lb 12.8 oz (110.1 kg)   SpO2 98%   BMI 30.35 kg/m  Gen: NAD, resting comfortably     Assessment and Plan   # Bronchitis follow up  S:saw Dr. Mardelle Matte 07/23/22 and treated for bronchitis in case bacterial with azithromycin.  Symptoms present for 4 days at that point and COVID tests negative. No sinus pressure at that time.  A/P: he reports full  resolution on azithromycin thankfully.    #hypertension S: medication: amlodipine 10 mg, benazepril 40 mg, metoprolol 50 mg XR  -down to 242 from 248- has improved diet and tried to skip meal- cut down on bread and alcohol (none in 1.5 years. Stuck at 242- calorie counting isn't best fit BP Readings from Last 3 Encounters:  09/13/22 128/70  07/23/22 138/74  06/08/22 (!) 148/70  A/P: stable- continue current medicines   # B12 deficiency presented with paresthesias with levels in 300 S: Current treatment/medication (oral vs. IM): B12 weekly on monday  -tingling resolved on B12, occasional mild numbness in a toe like swelling sensation A/P: doing much better on B12- will continue weekly treatments   #Health maintenance- plans to schedule his colonoscopy  Recommended follow up: Return in about 6 months (around 03/16/2023) for physical or sooner if needed.Schedule b4 you leave. Future Appointments  Date Time Provider Department Center  03/25/2023  9:00 AM Shelva Majestic, MD [facility]    Lab/Order associations:   ICD-10-CM   1. Essential hypertension  I10     2. B12 deficiency  E53.8       No orders of the defined types were placed in  this encounter.   Return precautions advised.  Tana Conch, MD

## 2022-10-05 ENCOUNTER — Encounter: Payer: 59 | Admitting: Family Medicine

## 2022-10-31 DIAGNOSIS — H35033 Hypertensive retinopathy, bilateral: Secondary | ICD-10-CM | POA: Diagnosis not present

## 2022-10-31 DIAGNOSIS — D3131 Benign neoplasm of right choroid: Secondary | ICD-10-CM | POA: Diagnosis not present

## 2022-10-31 DIAGNOSIS — H2513 Age-related nuclear cataract, bilateral: Secondary | ICD-10-CM | POA: Diagnosis not present

## 2022-10-31 DIAGNOSIS — H43821 Vitreomacular adhesion, right eye: Secondary | ICD-10-CM | POA: Diagnosis not present

## 2022-10-31 DIAGNOSIS — H43812 Vitreous degeneration, left eye: Secondary | ICD-10-CM | POA: Diagnosis not present

## 2022-10-31 DIAGNOSIS — H47323 Drusen of optic disc, bilateral: Secondary | ICD-10-CM | POA: Diagnosis not present

## 2022-11-06 ENCOUNTER — Other Ambulatory Visit: Payer: Self-pay | Admitting: Family Medicine

## 2022-11-06 DIAGNOSIS — I10 Essential (primary) hypertension: Secondary | ICD-10-CM

## 2022-12-31 ENCOUNTER — Ambulatory Visit (INDEPENDENT_AMBULATORY_CARE_PROVIDER_SITE_OTHER): Payer: Medicare HMO

## 2022-12-31 VITALS — Wt 242.0 lb

## 2022-12-31 DIAGNOSIS — Z1211 Encounter for screening for malignant neoplasm of colon: Secondary | ICD-10-CM | POA: Diagnosis not present

## 2022-12-31 DIAGNOSIS — Z0000 Encounter for general adult medical examination without abnormal findings: Secondary | ICD-10-CM

## 2022-12-31 NOTE — Progress Notes (Signed)
Subjective:   Bradley Cole. is a 67 y.o. male who presents for Medicare Annual/Subsequent preventive examination.  Visit Complete: Virtual I connected with  Jena Gauss. on 12/31/22 by a audio enabled telemedicine application and verified that I am speaking with the correct person using two identifiers.  Patient Location: Home  Provider Location: Office/Clinic  I discussed the limitations of evaluation and management by telemedicine. The patient expressed understanding and agreed to proceed.  Vital Signs: Because this visit was a virtual/telehealth visit, some criteria may be missing or patient reported. Any vitals not documented were not able to be obtained and vitals that have been documented are patient reported.  ered by patient is correct and no changes since this date.  Cardiac Risk Factors include: advanced age (>52men, >6 women);dyslipidemia;hypertension;male gender;obesity (BMI >30kg/m2)     Objective:    Today's Vitals   12/31/22 0807  Weight: 242 lb (109.8 kg)   Body mass index is 30.25 kg/m.     12/31/2022    8:10 AM 01/18/2017    7:26 AM 07/30/2016    8:10 AM 03/29/2015    9:11 AM 03/08/2015    9:29 AM 03/08/2015    9:24 AM  Advanced Directives  Does Patient Have a Medical Advance Directive? Yes Yes Yes Yes No No  Type of Estate agent of [place];Living will Healthcare Power of [place];Living will Living will;Healthcare Power of [employer] Power of [place];Living will    Copy of Healthcare Power of Attorney in Chart? No - copy requested       Would patient like information on creating a medical advance directive?     No - patient declined information No - patient declined information    Current Medications (verified) Outpatient Encounter Medications as of 12/31/2022  Medication Sig   amLODipine (NORVASC) 10 MG tablet TAKE 1 TABLET BY MOUTH DAILY   benazepril (LOTENSIN) 40 MG tablet TAKE 1 TABLET(40 MG) BY  MOUTH DAILY   Multiple Vitamins-Minerals (MEGA MULTI MEN PO) Take by mouth daily.   rosuvastatin (CRESTOR) 20 MG tablet TAKE 1 TABLET BY MOUTH 3 TIMES A WEEK.   TOPROL XL 50 MG 24 hr tablet TAKE 1 TABLET BY MOUTH EVERY DAY WITH OR IMMEDIATELY FOLLOWING A MEAL   No facility-administered encounter medications on file as of 12/31/2022.    Allergies (verified) Augmentin [amoxicillin-pot clavulanate] and Penicillins   History: Past Medical History:  Diagnosis Date   DDD (degenerative disc disease)    Degenerative disc disease, lumbar    Degenerative disc disease, lumbar    Heart murmur    History of skin cancer    nose- with Dr. Nicholas Lose    Hyperlipidemia    LDL goal < 100, ideally < 70   Hypertension    Meniscal injury, left, initial encounter    [facility] following- has a flap. will wait on surgery until cannot bear it.    Past Surgical History:  Procedure Laterality Date   COLONOSCOPY  2007   negative, [facility]   KNEE SURGERY     R knee torn ligament   POLYPECTOMY     Family History  Problem Relation Age of Onset   Hypertension Father    Mitral valve prolapse Father    Lymphoma Father        mantel cell   Glaucoma Father    Macular degeneration Father    Benign prostatic hyperplasia Father    Diabetes Father    Hypertension  Mother    Heart disease Mother        valvular heart disease   Heart attack Mother        based on findings @ valve replacement   Migraines Mother    Heart attack Maternal Grandfather        in 40s   Heart disease Maternal Grandfather    Diabetes Paternal Grandmother    Hypertension Paternal Grandmother    Alcohol abuse Son    Heart disease Maternal Grandmother    Stroke Neg Hx    Colon cancer Neg Hx    Stomach cancer Neg Hx    Rectal cancer Neg Hx    Social History   Socioeconomic History   Marital status: Married    Spouse name: Not on file   Number of children: Not on file   Years of education: Not on file   Highest  education level: Not on file  Occupational History   Not on file  Tobacco Use   Smoking status: Former    Types: Cigars    Quit date: 11/07/2010    Years since quitting: 12.1   Smokeless tobacco: Former    Types: Chew   Tobacco comments:    quite smoking a year ago.  Vaping Use   Vaping status: Never Used  Substance and Sexual Activity   Alcohol use: Yes    Alcohol/week: 3.0 standard drinks of alcohol    Types: 3 Glasses of wine per week    Comment: socially    Drug use: No   Sexual activity: Not on file  Other Topics Concern   Not on file  Social History Narrative   Married 37 years in 2017. 2 children 63 and 14. 4 grandkids (1 on way included) in 2017.       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)   Plans to never retire      Hobbies: maintaining farm, judging field trials- any kind of Nurse, learning disability, Producer, television/film/video to sort cattle   Social Determinants of Corporate investment banker Strain: Low Risk  (12/31/2022)   Overall Financial Resource Strain (CARDIA)    Difficulty of Paying Living Expenses: Not hard at all  Food Insecurity: No Food Insecurity (12/31/2022)   Hunger Vital Sign    Worried About Running Out of Food in the Last Year: Never true    Ran Out of Food in the Last Year: Never true  Transportation Needs: No Transportation Needs (12/31/2022)   PRAPARE - Administrator, Civil Service (Medical): No    Lack of Transportation (Non-Medical): No  Physical Activity: Sufficiently Active (12/31/2022)   Exercise Vital Sign    Days of Exercise per Week: 4 days    Minutes of Exercise per Session: 40 min  Stress: No Stress Concern Present (12/31/2022)   [employer] of Occupational Health - Occupational Stress Questionnaire    Feeling of Stress : Not at all  Social Connections: Moderately Integrated (12/31/2022)   Social Connection and Isolation Panel [NHANES]    Frequency of Communication with Friends and Family: More than three times  a week    Frequency of Social Gatherings with Friends and Family: More than three times a week    Attends Religious Services: More than 4 times per year    Active Member of [employer] or Organizations: No    Attends Banker Meetings: Never    Marital Status: Married    Tobacco Counseling Counseling given: Not  Answered Tobacco comments: quite smoking a year ago.   Clinical Intake:  Pre-visit preparation completed: Yes  Pain : No/denies pain     BMI - recorded: 30.25 Nutritional Status: BMI > 30  Obese Nutritional Risks: None Diabetes: No  How often do you need to have someone help you when you read instructions, pamphlets, or other written materials from your doctor or pharmacy?: 1 - Never  Interpreter Needed?: No  Information entered by :: Lanier Ensign, LPN   Activities of Daily Living    12/31/2022    8:08 AM  In your present state of health, do you have any difficulty performing the following activities:  Hearing? 0  Vision? 0  Difficulty concentrating or making decisions? 0  Walking or climbing stairs? 0  Dressing or bathing? 0  Doing errands, shopping? 0  Preparing Food and eating ? N  Using the Toilet? N  In the past six months, have you accidently leaked urine? N  Do you have problems with loss of bowel control? N  Managing your Medications? N  Managing your Finances? N  Housekeeping or managing your Housekeeping? N    Patient Care Team: Shelva Majestic, MD as PCP - General (Family Medicine) Wyline Mood Alben Spittle, MD as PCP - Cardiology (Cardiology)  Indicate any recent Medical Services you may have received from other than [facility] providers in the past year (date may be approximate).     Assessment:   This is a routine wellness examination for [place].  Hearing/Vision screen Hearing Screening - Comments:: Pt denies any hearing issues  Vision Screening - Comments:: Pt follows up with eye provider as directed    Goals Addressed                This Visit's Progress     Patient Stated (pt-stated)        Maintain health and activity        Depression Screen    12/31/2022    8:10 AM 07/23/2022   11:24 AM 06/08/2022    1:00 PM 03/07/2021    2:30 PM 03/03/2020    2:42 PM 02/19/2018   11:37 AM 11/25/2017    8:26 AM  PHQ 2/9 Scores  PHQ - 2 Score 0 0 0 0 0 0 0  PHQ- 9 Score  0 0 0       Fall Risk    12/31/2022    8:11 AM 07/23/2022   11:24 AM 06/08/2022    1:00 PM 03/07/2021    2:30 PM 03/03/2020    2:42 PM  Fall Risk   Falls in the past year? 0 0 0 0 0  Number falls in past yr: 0 0 0 0 0  Injury with Fall? 0 0 0 0 0  Risk for fall due to : No Fall Risks No Fall Risks No Fall Risks    Follow up Falls prevention discussed Falls evaluation completed Falls evaluation completed      MEDICARE RISK AT HOME: Medicare Risk at Home Any stairs in or around the home?: Yes If so, are there any without handrails?: No Home free of loose throw rugs in walkways, pet beds, electrical cords, etc?: Yes Adequate lighting in your home to reduce risk of falls?: Yes Life alert?: No Use of a cane, walker or w/c?: No Grab bars in the bathroom?: No Shower chair or bench in shower?: No Elevated toilet seat or a handicapped toilet?: No  TIMED UP AND GO:  Was the test performed?  No    Cognitive Function:        12/31/2022    8:12 AM  6CIT Screen  What Year? 0 points  What month? 0 points  What time? 0 points  Count back from 20 0 points  Months in reverse 0 points  Repeat phrase 0 points  Total Score 0 points    Immunizations Immunization History  Administered Date(s) Administered   PFIZER(Purple Top)SARS-COV-2 Vaccination 04/27/2019, 05/18/2019   Pfizer Covid-19 Vaccine Bivalent Booster 64yrs & up 01/17/2020   Tdap 03/03/2012, 07/08/2015   Zoster Recombinant(Shingrix) 03/07/2021, 07/19/2021    TDAP status: Up to date  Flu Vaccine status: Due, Education has been provided regarding the importance of this vaccine.  Advised may receive this vaccine at local pharmacy or Health Dept. Aware to provide a copy of the vaccination record if obtained from local pharmacy or Health Dept. Verbalized acceptance and understanding.  Pneumococcal vaccine status: Due, Education has been provided regarding the importance of this vaccine. Advised may receive this vaccine at local pharmacy or Health Dept. Aware to provide a copy of the vaccination record if obtained from local pharmacy or Health Dept. Verbalized acceptance and understanding.  Covid-19 vaccine status: Information provided on how to obtain vaccines.   Qualifies for Shingles Vaccine? Yes   Zostavax completed Yes   Shingrix Completed?: Yes  Screening Tests Health Maintenance  Topic Date Due   Colonoscopy  01/18/2022   COVID-19 Vaccine (4 - 2023-24 season) 10/07/2022   INFLUENZA VACCINE  05/06/2023 (Originally 09/06/2022)   Pneumonia Vaccine 36+ Years old (1 of 1 - PCV) 09/13/2023 (Originally 02/25/2020)   Medicare Annual Wellness (AWV)  12/31/2023   DTaP/Tdap/Td (3 - Td or Tdap) 07/07/2025   Hepatitis C Screening  Completed   Zoster Vaccines- Shingrix  Completed   HPV VACCINES  Aged Out    Health Maintenance  Health Maintenance Due  Topic Date Due   Colonoscopy  01/18/2022   COVID-19 Vaccine (4 - 2023-24 season) 10/07/2022    Colorectal cancer screening: Referral to GI placed 12/31/22. Pt aware the office will call re: appt.  Additional Screening:  Hepatitis C Screening:  Completed 03/03/20  Vision Screening: Recommended annual ophthalmology exams for early detection of glaucoma and other disorders of the eye. Is the patient up to date with their annual eye exam?  Yes  Who is the provider or what is the name of the office in which the patient attends annual eye exams? Provider in [place]  If pt is not established with a provider, would they like to be referred to a provider to establish care? No .   Dental Screening: Recommended annual dental  exams for proper oral hygiene  Community Resource Referral / Chronic Care Management: CRR required this visit?  No   CCM required this visit?  No     Plan:     I have personally reviewed and noted the following in the patient's chart:   Medical and social history Use of alcohol, tobacco or illicit drugs  Current medications and supplements including opioid prescriptions. Patient is not currently taking opioid prescriptions. Functional ability and status Nutritional status Physical activity Advanced directives List of other physicians Hospitalizations, surgeries, and ER visits in previous 12 months Vitals Screenings to include cognitive, depression, and falls Referrals and appointments  In addition, I have reviewed and discussed with patient certain preventive protocols, quality metrics, and best practice recommendations. A written personalized care plan for preventive services as well as general preventive health recommendations  were provided to patient.     Marzella Schlein, LPN   16/11/9602   After Visit Summary: (MyChart) Due to this being a telephonic visit, the after visit summary with patients personalized plan was offered to patient via MyChart   Nurse Notes: none

## 2022-12-31 NOTE — Patient Instructions (Signed)
Bradley Cole , Thank you for taking time to come for your Medicare Wellness Visit. I appreciate your ongoing commitment to your health goals. Please review the following plan we discussed and let me know if I can assist you in the future.   Referrals/Orders/Follow-Ups/Clinician Recommendations: maintain health and activity   This is a list of the screening recommended for you and due dates:  Health Maintenance  Topic Date Due   Colon Cancer Screening  01/18/2022   COVID-19 Vaccine (4 - 2023-24 season) 10/07/2022   Flu Shot  05/06/2023*   Pneumonia Vaccine (1 of 1 - PCV) 09/13/2023*   Medicare Annual Wellness Visit  12/31/2023   DTaP/Tdap/Td vaccine (3 - Td or Tdap) 07/07/2025   Hepatitis C Screening  Completed   Zoster (Shingles) Vaccine  Completed   HPV Vaccine  Aged Out  *Topic was postponed. The date shown is not the original due date.    Advanced directives: (Copy Requested) Please bring a copy of your health care power of attorney and living will to the office to be added to your chart at your convenience.  Next Medicare Annual Wellness Visit scheduled for next year: Yes

## 2023-01-28 ENCOUNTER — Encounter: Payer: Self-pay | Admitting: Family Medicine

## 2023-01-29 ENCOUNTER — Encounter: Payer: Self-pay | Admitting: Physician Assistant

## 2023-01-29 ENCOUNTER — Ambulatory Visit (INDEPENDENT_AMBULATORY_CARE_PROVIDER_SITE_OTHER): Payer: Medicare HMO | Admitting: Physician Assistant

## 2023-01-29 VITALS — BP 110/70 | HR 61 | Temp 97.5°F | Ht 75.0 in | Wt 247.0 lb

## 2023-01-29 DIAGNOSIS — J029 Acute pharyngitis, unspecified: Secondary | ICD-10-CM

## 2023-01-29 LAB — POCT RAPID STREP A (OFFICE): Rapid Strep A Screen: NEGATIVE

## 2023-01-29 MED ORDER — AZITHROMYCIN 250 MG PO TABS: ORAL_TABLET | ORAL | 0 refills | Status: AC

## 2023-01-29 NOTE — Patient Instructions (Signed)
Vitals:   01/29/23 1133  BP: 110/70  Pulse: 61  Temp: (!) 97.5 F (36.4 C)  SpO2: 99%

## 2023-01-29 NOTE — Progress Notes (Signed)
Bradley Cole. is a 67 y.o. male here for a new problem.  History of Present Illness:   Chief Complaint  Patient presents with   Sore Throat    Pt c/o sore throat, sinus pressure and congestion, cough and expectorating thick clear sputum, started on Saturday. COVID & Flu Negative. Pt's wife has Strep.   HPI  Cough/Congestion: Complains of sore throat and cough/congestion.   Endorses sick contact with her grandson who had PNA and his wife who has had strep. Covid and Flu tests negative.  Describes that his congestion is mainly sinus causing a headache and his mucus is thick but clear.  His sore throat mainly occurs at night, attributed to his cough throughout the day.  Tried a Hydrologist that did not relieve his cough.   Past Medical History:  Diagnosis Date   DDD (degenerative disc disease)    Degenerative disc disease, lumbar    Degenerative disc disease, lumbar    Heart murmur    History of skin cancer    nose- with Dr. Nicholas Lose    Hyperlipidemia    LDL goal < 100, ideally < 70   Hypertension    Meniscal injury, left, initial encounter    [facility] following- has a flap. will wait on surgery until cannot bear it.     Social History   Tobacco Use   Smoking status: Former    Types: Cigars    Quit date: 11/07/2010    Years since quitting: 12.2   Smokeless tobacco: Former    Types: Chew   Tobacco comments:    quite smoking a year ago.  Vaping Use   Vaping status: Never Used  Substance Use Topics   Alcohol use: Yes    Alcohol/week: 3.0 standard drinks of alcohol    Types: 3 Glasses of wine per week    Comment: socially    Drug use: No   Past Surgical History:  Procedure Laterality Date   COLONOSCOPY  2007   negative, [facility]   KNEE SURGERY     R knee torn ligament   POLYPECTOMY     Family History  Problem Relation Age of Onset   Hypertension Father    Mitral valve prolapse Father    Lymphoma Father        mantel cell   Glaucoma  Father    Macular degeneration Father    Benign prostatic hyperplasia Father    Diabetes Father    Hypertension Mother    Heart disease Mother        valvular heart disease   Heart attack Mother        based on findings @ valve replacement   Migraines Mother    Heart attack Maternal Grandfather        in 53s   Heart disease Maternal Grandfather    Diabetes Paternal Grandmother    Hypertension Paternal Grandmother    Alcohol abuse Son    Heart disease Maternal Grandmother    Stroke Neg Hx    Colon cancer Neg Hx    Stomach cancer Neg Hx    Rectal cancer Neg Hx    Allergies  Allergen Reactions   Augmentin [Amoxicillin-Pot Clavulanate] Rash    Severe rash over entire body   Penicillins     Rash @ 14; he has had Ampicillin since w/o adverse  effect   Current Medications:   Current Outpatient Medications:    amLODipine (NORVASC) 10 MG tablet, TAKE 1  TABLET BY MOUTH DAILY, Disp: 30 tablet, Rfl: 2   azithromycin (ZITHROMAX) 250 MG tablet, Take 2 tablets on day 1, then 1 tablet daily on days 2 through 5, Disp: 6 tablet, Rfl: 0   benazepril (LOTENSIN) 40 MG tablet, TAKE 1 TABLET(40 MG) BY MOUTH DAILY, Disp: 90 tablet, Rfl: 3   Multiple Vitamins-Minerals (MEGA MULTI MEN PO), Take by mouth daily., Disp: , Rfl:    rosuvastatin (CRESTOR) 20 MG tablet, TAKE 1 TABLET BY MOUTH 3 TIMES A WEEK., Disp: 270 tablet, Rfl: 3   TOPROL XL 50 MG 24 hr tablet, TAKE 1 TABLET BY MOUTH EVERY DAY WITH OR IMMEDIATELY FOLLOWING A MEAL, Disp: 90 tablet, Rfl: 2  Review of Systems:   ROS See pertinent positives and negatives as per the HPI.  Vitals:   Vitals:   01/29/23 1133  BP: 110/70  Pulse: 61  Temp: (!) 97.5 F (36.4 C)  TempSrc: Temporal  SpO2: 99%  Weight: 247 lb (112 kg)  Height: 6\' 3"  (1.905 m)     Body mass index is 30.87 kg/m.  Physical Exam:   Physical Exam Vitals and nursing note reviewed.  Constitutional:      General: He is not in acute distress.    Appearance: He is  well-developed. He is not ill-appearing or toxic-appearing.  HENT:     Head: Normocephalic and atraumatic.     Right Ear: Tympanic membrane, ear canal and external ear normal. Tympanic membrane is not erythematous, retracted or bulging.     Left Ear: Tympanic membrane, ear canal and external ear normal. Tympanic membrane is not erythematous, retracted or bulging.     Nose:     Right Sinus: Frontal sinus tenderness present. No maxillary sinus tenderness.     Left Sinus: Frontal sinus tenderness present. No maxillary sinus tenderness.     Mouth/Throat:     Pharynx: Uvula midline. No posterior oropharyngeal erythema.  Eyes:     General: Lids are normal.     Conjunctiva/sclera: Conjunctivae normal.  Neck:     Trachea: Trachea normal.  Cardiovascular:     Rate and Rhythm: Normal rate and regular rhythm.     Heart sounds: Normal heart sounds, S1 normal and S2 normal.  Pulmonary:     Effort: Pulmonary effort is normal.     Breath sounds: Normal breath sounds. No decreased breath sounds, wheezing, rhonchi or rales.  Lymphadenopathy:     Cervical: No cervical adenopathy.  Skin:    General: Skin is warm and dry.  Neurological:     Mental Status: He is alert.  Psychiatric:        Speech: Speech normal.        Behavior: Behavior normal. Behavior is cooperative.    Results for orders placed or performed in visit on 01/29/23  POCT rapid strep A  Result Value Ref Range   Rapid Strep A Screen Negative Negative     Assessment and Plan:   Sore throat No red flags on exam.   Will initiate azithromycin for suspected sinusitis per orders and per patient request on this antibiotic. Discussed taking medications as prescribed.  Reviewed return precautions including new or worsening fever, SOB, new or worsening cough or other concerns.  Push fluids and rest.  I recommend that patient follow-up if symptoms worsen or persist despite treatment x 7-10 days, sooner if needed.  I,Emily Lagle,acting  as a Neurosurgeon for [employer], PA.,have documented all relevant documentation on the behalf of Jarold Motto, PA,as directed  by  Jarold Motto, PA while in the presence of [place], [place].  I, Jarold Motto, [place], have reviewed all documentation for this visit. The documentation on 01/29/23 for the exam, diagnosis, procedures, and orders are all accurate and complete.  Jarold Motto, PA-C

## 2023-02-26 ENCOUNTER — Other Ambulatory Visit: Payer: Self-pay

## 2023-02-26 MED ORDER — AMLODIPINE BESYLATE 10 MG PO TABS: 10.0000 mg | ORAL_TABLET | Freq: Every day | ORAL | 2 refills | Status: DC

## 2023-02-27 ENCOUNTER — Encounter: Payer: 59 | Admitting: Family Medicine

## 2023-03-19 DIAGNOSIS — H47323 Drusen of optic disc, bilateral: Secondary | ICD-10-CM | POA: Diagnosis not present

## 2023-03-19 DIAGNOSIS — H2513 Age-related nuclear cataract, bilateral: Secondary | ICD-10-CM | POA: Diagnosis not present

## 2023-03-19 DIAGNOSIS — H43821 Vitreomacular adhesion, right eye: Secondary | ICD-10-CM | POA: Diagnosis not present

## 2023-03-19 DIAGNOSIS — D3131 Benign neoplasm of right choroid: Secondary | ICD-10-CM | POA: Diagnosis not present

## 2023-03-19 DIAGNOSIS — H35033 Hypertensive retinopathy, bilateral: Secondary | ICD-10-CM | POA: Diagnosis not present

## 2023-03-19 DIAGNOSIS — H43812 Vitreous degeneration, left eye: Secondary | ICD-10-CM | POA: Diagnosis not present

## 2023-03-25 ENCOUNTER — Encounter: Payer: Self-pay | Admitting: Family Medicine

## 2023-03-25 ENCOUNTER — Ambulatory Visit (INDEPENDENT_AMBULATORY_CARE_PROVIDER_SITE_OTHER): Payer: Medicare HMO | Admitting: Family Medicine

## 2023-03-25 ENCOUNTER — Other Ambulatory Visit: Payer: Self-pay

## 2023-03-25 VITALS — BP 120/70 | HR 74 | Ht 75.0 in | Wt 246.6 lb

## 2023-03-25 DIAGNOSIS — R972 Elevated prostate specific antigen [PSA]: Secondary | ICD-10-CM

## 2023-03-25 DIAGNOSIS — Z0000 Encounter for general adult medical examination without abnormal findings: Secondary | ICD-10-CM

## 2023-03-25 DIAGNOSIS — E669 Obesity, unspecified: Secondary | ICD-10-CM

## 2023-03-25 DIAGNOSIS — Z125 Encounter for screening for malignant neoplasm of prostate: Secondary | ICD-10-CM | POA: Diagnosis not present

## 2023-03-25 DIAGNOSIS — E782 Mixed hyperlipidemia: Secondary | ICD-10-CM

## 2023-03-25 DIAGNOSIS — I10 Essential (primary) hypertension: Secondary | ICD-10-CM

## 2023-03-25 DIAGNOSIS — E538 Deficiency of other specified B group vitamins: Secondary | ICD-10-CM | POA: Diagnosis not present

## 2023-03-25 DIAGNOSIS — M10072 Idiopathic gout, left ankle and foot: Secondary | ICD-10-CM

## 2023-03-25 DIAGNOSIS — Z131 Encounter for screening for diabetes mellitus: Secondary | ICD-10-CM | POA: Diagnosis not present

## 2023-03-25 LAB — COMPREHENSIVE METABOLIC PANEL
ALT: 32 U/L (ref 0–53)
AST: 26 U/L (ref 0–37)
Albumin: 4.4 g/dL (ref 3.5–5.2)
Alkaline Phosphatase: 71 U/L (ref 39–117)
BUN: 17 mg/dL (ref 6–23)
CO2: 26 meq/L (ref 19–32)
Calcium: 8.9 mg/dL (ref 8.4–10.5)
Chloride: 104 meq/L (ref 96–112)
Creatinine, Ser: 0.94 mg/dL (ref 0.40–1.50)
GFR: 83.55 mL/min (ref >60.00)
Glucose, Bld: 89 mg/dL (ref 70–99)
Potassium: 4.3 meq/L (ref 3.5–5.1)
Sodium: 138 meq/L (ref 135–145)
Total Bilirubin: 0.8 mg/dL (ref 0.2–1.2)
Total Protein: 7.5 g/dL (ref 6.0–8.3)

## 2023-03-25 LAB — LIPID PANEL
Cholesterol: 145 mg/dL (ref 0–200)
HDL: 40 mg/dL (ref >39.00)
LDL Cholesterol: 70 mg/dL (ref 0–99)
NonHDL: 105
Total CHOL/HDL Ratio: 4
Triglycerides: 175 mg/dL — ABNORMAL HIGH (ref 0.0–149.0)
VLDL: 35 mg/dL (ref 0.0–40.0)

## 2023-03-25 LAB — CBC WITH DIFFERENTIAL/PLATELET
Basophils Absolute: 0 10*3/uL (ref 0.0–0.1)
Basophils Relative: 0.2 % (ref 0.0–3.0)
Eosinophils Absolute: 0.2 10*3/uL (ref 0.0–0.7)
Eosinophils Relative: 2.2 % (ref 0.0–5.0)
HCT: 42 % (ref 39.0–52.0)
Hemoglobin: 14.1 g/dL (ref 13.0–17.0)
Lymphocytes Relative: 30.8 % (ref 12.0–46.0)
Lymphs Abs: 2.1 10*3/uL (ref 0.7–4.0)
MCHC: 33.6 g/dL (ref 30.0–36.0)
MCV: 88.6 fL (ref 78.0–100.0)
Monocytes Absolute: 0.5 10*3/uL (ref 0.1–1.0)
Monocytes Relative: 7.8 % (ref 3.0–12.0)
Neutro Abs: 4.1 10*3/uL (ref 1.4–7.7)
Neutrophils Relative %: 59 % (ref 43.0–77.0)
Platelets: 279 10*3/uL (ref 150.0–400.0)
RBC: 4.74 Mil/uL (ref 4.22–5.81)
RDW: 13.2 % (ref 11.5–15.5)
WBC: 7 10*3/uL (ref 4.0–10.5)

## 2023-03-25 LAB — HEMOGLOBIN A1C: Hgb A1c MFr Bld: 5.4 % (ref 4.6–6.5)

## 2023-03-25 LAB — VITAMIN B12: Vitamin B-12: 568 pg/mL (ref 211–911)

## 2023-03-25 NOTE — Patient Instructions (Addendum)
Health Maintenance Due  Topic Date Due   Colonoscopy  01/18/2022  Please call to get this scheduled  Please stop by lab before you go If you have mychart- we will send your results within 3 business days of [place] receiving them.  If you do not have mychart- we will call you about results within 5 business days of [place] receiving them.  *please also note that you will see labs on mychart as soon as they post. I will later go in and write notes on them- will say "notes from Dr. Durene Cal"   Team please give him exercises for biceps tendonitis and rotator cuff tendonitis I want you to do the exercise 3x a week for a month then once a week for another month. Stop any exercise that causes more than 1-2/10 pain increase. If not doing better within 1-2 months let us refer you to sports medicine or orthopedics   Recommended follow up: Return in about 1 year (around 03/24/2024) for physical or sooner if needed.Schedule b4 you leave.

## 2023-03-25 NOTE — Progress Notes (Signed)
Phone: [phone]   Subjective:  Patient presents today for their annual physical. Chief complaint-noted.   See problem oriented charting- ROS- full  review of systems was completed and negative  except for: right knee pain, mild right shoulder pain at times- no neck pain  The following were reviewed and entered/updated in epic: Past Medical History:  Diagnosis Date   DDD (degenerative disc disease)    Degenerative disc disease, lumbar    Degenerative disc disease, lumbar    Heart murmur    History of skin cancer    nose- with Dr. Nicholas Lose    Hyperlipidemia    LDL goal < 100, ideally < 70   Hypertension    Meniscal injury, left, initial encounter    [facility] following- has a flap. will wait on surgery until cannot bear it.    Patient Active Problem List   Diagnosis Date Noted   B12 deficiency 09/13/2022    Priority: Medium    History of adenomatous polyp of colon 02/01/2017    Priority: Medium    Essential hypertension 12/30/2007    Priority: Medium    HYPERLIPIDEMIA 11/19/2006    Priority: Medium    Tobacco use disorder 07/09/2015    Priority: Low   MVP (mitral valve prolapse) 07/08/2015    Priority: Low   DDD (degenerative disc disease)     Priority: Low   Gout 12/30/2007    Priority: Low   Meniscal injury, left, initial encounter    History of skin cancer    Past Surgical History:  Procedure Laterality Date   COLONOSCOPY  2007   negative, [facility]   KNEE SURGERY     R knee torn ligament   POLYPECTOMY      Family History  Problem Relation Age of Onset   Hypertension Father    Mitral valve prolapse Father    Lymphoma Father        mantel cell   Glaucoma Father    Macular degeneration Father    Benign prostatic hyperplasia Father    Diabetes Father    Hypertension Mother    Heart disease Mother        valvular heart disease   Heart attack Mother        based on findings @ valve replacement   Migraines Mother    Heart attack  Maternal Grandfather        in 70s   Heart disease Maternal Grandfather    Diabetes Paternal Grandmother    Hypertension Paternal Grandmother    Alcohol abuse Son    Heart disease Maternal Grandmother    Stroke Neg Hx    Colon cancer Neg Hx    Stomach cancer Neg Hx    Rectal cancer Neg Hx     Medications- reviewed and updated Current Outpatient Medications  Medication Sig Dispense Refill   amLODipine (NORVASC) 10 MG tablet Take 1 tablet (10 mg total) by mouth daily. 90 tablet 2   benazepril (LOTENSIN) 40 MG tablet TAKE 1 TABLET(40 MG) BY MOUTH DAILY 90 tablet 3   Multiple Vitamins-Minerals (MEGA MULTI MEN PO) Take by mouth daily.     rosuvastatin (CRESTOR) 20 MG tablet TAKE 1 TABLET BY MOUTH 3 TIMES A WEEK. 270 tablet 3   TOPROL XL 50 MG 24 hr tablet TAKE 1 TABLET BY MOUTH EVERY DAY WITH OR IMMEDIATELY FOLLOWING A MEAL 90 tablet 2   vitamin B-12 (CYANOCOBALAMIN) 100 MCG tablet Take 100 mcg by mouth daily.  No current facility-administered medications for this visit.    Allergies-reviewed and updated Allergies  Allergen Reactions   Augmentin [Amoxicillin-Pot Clavulanate] Rash    Severe rash over entire body   Penicillins     Rash @ 14; he has had Ampicillin since w/o adverse  effect    Social History   Social History Narrative   Married 37 years in 2017. 2 children 85 and 38. 4 grandkids (1 on way included) in 2017.       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    -will still be involved for 3 years      Hobbies: maintaining farm, judging field trials- any kind of retreiver, learning to sort cattle   Objective  Objective:  BP 120/70   Pulse 74   Ht 6\' 3"  (1.905 m)   Wt 246 lb 9.6 oz (111.9 kg)   SpO2 98%   BMI 30.82 kg/m  Gen: NAD, resting comfortably HEENT: Mucous membranes are moist. Oropharynx normal Neck: no thyromegaly CV: RRR no murmurs rubs or gallops Lungs: CTAB no crackles,  wheeze, rhonchi Abdomen: soft/nontender/nondistended/normal bowel sounds. No rebound or guarding.  Ext: no edema Skin: warm, dry Neuro: grossly normal, moves all extremities, PERRLA Declines genitourinary or rectal exam   Assessment and Plan  68 y.o. male presenting for annual physical.  Health Maintenance counseling: 1. Anticipatory guidance: Patient counseled regarding regular dental exams -q6 months generally- slightly off right now, eye exams -yearly but more recent of the monitor spot on eye - has been stable so they think less likely cancer,  avoiding smoking and second hand smoke, limiting alcohol to 2 beverages per day - alcohol free, no illicit drugs .   2. Risk factor reduction:  Advised patient of need for regular exercise and diet rich and fruits and vegetables to reduce risk of heart attack and stroke.  Exercise- 3 times a week and going for 4 if posible.  Diet/weight management-mild weight loss noted from last Comprehensive Physical Exam (CPE) preventive care annual visit 2023 about 4 lbs.  Wt Readings from Last 3 Encounters:  03/25/23 246 lb 9.6 oz (111.9 kg)  01/29/23 247 lb (112 kg)  12/31/22 242 lb (109.8 kg)  3. Immunizations/screenings/ancillary studies- declines COVID, Prevnar 20, flu .  Immunization History  Administered Date(s) Administered   PFIZER(Purple Top)SARS-COV-2 Vaccination 04/27/2019, 05/18/2019, 01/06/2020   Pfizer Covid-19 Vaccine Bivalent Booster 24yrs & up 01/17/2020   Tdap 03/03/2012, 07/08/2015   Zoster Recombinant(Shingrix) 03/07/2021, 07/19/2021   4. Prostate cancer screening-  low risk prior trend- update psa today . Father with prostate issues but no cancer history Lab Results  Component Value Date   PSA 1.37 06/08/2022   PSA 1.09 10/04/2021   PSA 2.44 03/28/2021   5. Colon cancer screening - overdue for 5 year repeat from 01/18/17- gastroenterology reached out to patient- he has not heard back on mychart- encouraged him to call - plans to get  scheduled for march 6. Skin cancer screening- [facility] prior Dr. Nicholas Lose yearly in general- off lately- may restart. advised regular sunscreen use. Denies worrisome, changing, or new skin lesions.  7. Smoking associated screening (lung cancer screening, AAA screen 65-75, UA)- former smoker-  cigar free since 2017 and no recent dip /chew. Abdominal aortic aneurysm opts out for screening . Didn't smoke enough for lung cancer screening. Offered ua 8. STD screening - only active with wife  Status of chronic or  acute concerns   #hypertension S: medication: amlodipine 10 mg, benazepril 40 mg, metoprolol 50 mg XR  A/P: well controlled continue current medications   #hyperlipidemia S: Medication: rosuvastatin 20 mg 3x a week Lab Results  Component Value Date   CHOL 127 06/08/2022   HDL 38.40 (L) 06/08/2022   LDLCALC 58 06/08/2022   LDLDIRECT 112.0 03/01/2020   TRIG 151.0 (H) 06/08/2022   CHOLHDL 3 06/08/2022  A/P: hopefully stable this year- update lipid panel- continue current medications   # B12 deficiency presented with paresthesias with levels in 300 S: Current treatment/medication (oral vs. IM): B12 weekly on monday  -tingling resolved on B12, occasional mild numbness in a toe like swelling sensation Lab Results  Component Value Date   VITAMINB12 363 06/08/2022  A/P: update today- may go to twice a week if still under 400. 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  Lab/Order associations: fasting   ICD-10-CM   1. Preventative health care  Z00.00     2. Essential hypertension  I10     3. HYPERLIPIDEMIA  E78.2     4. B12 deficiency  E53.8     5. Acute idiopathic gout involving toe of left foot  M10.072     6. Screening for prostate cancer  Z12.5     7. Screening  for diabetes mellitus  Z13.1     8. Obesity (BMI 30-39.9)  E66.9       No orders of the defined types were placed in this encounter.   Return precautions advised.  Tana Conch, MD

## 2023-03-26 ENCOUNTER — Encounter: Payer: Self-pay | Admitting: Family Medicine

## 2023-03-26 ENCOUNTER — Other Ambulatory Visit (INDEPENDENT_AMBULATORY_CARE_PROVIDER_SITE_OTHER): Payer: Medicare HMO

## 2023-03-26 DIAGNOSIS — R972 Elevated prostate specific antigen [PSA]: Secondary | ICD-10-CM | POA: Diagnosis not present

## 2023-03-26 LAB — PSA: PSA: 1.37 ng/mL (ref 0.10–4.00)

## 2023-03-27 ENCOUNTER — Other Ambulatory Visit (HOSPITAL_COMMUNITY): Payer: Self-pay

## 2023-03-27 ENCOUNTER — Telehealth: Payer: Self-pay

## 2023-03-27 NOTE — Telephone Encounter (Signed)
Pharmacy Patient Advocate Encounter   Received notification from Fax that prior authorization for Toprol XL 50MG  er tablets is required/requested.   Insurance verification completed.   The patient is insured through [facility] .   Per test claim: The current 90 day co-pay is, $40.62.  No PA needed at this time. This test claim was processed through [employer]- copay amounts may vary at other pharmacies due to pharmacy/plan contracts, or as the patient moves through the different stages of their insurance plan.     Per test claim: PA expires 02/05/24

## 2023-03-29 NOTE — Telephone Encounter (Signed)
Left message on voicemail to call office.  Pharmacy Patient Advocate Encounter   Received notification from Fax that prior authorization for Toprol XL 50MG  er tablets is required/requested.   Insurance verification completed.   The patient is insured through [facility] .   Per test claim: The current 90 day co-pay is, $40.62.  No PA needed at this time. This test claim was processed through [facility]- copay amounts may vary at other pharmacies due to pharmacy/plan contracts, or as the patient moves through the different stages of their insurance plan.      Per test claim: PA expires 02/05/24

## 2023-03-29 NOTE — Telephone Encounter (Signed)
Hi PA team, can this be re ran please? Pt can NOT do generic form of the Toprol due to it causing him to have a cough, he has tried and failed the Generic form there for he needs name brand only please.

## 2023-04-10 ENCOUNTER — Ambulatory Visit (AMBULATORY_SURGERY_CENTER)

## 2023-04-10 VITALS — Ht 75.0 in | Wt 240.0 lb

## 2023-04-10 DIAGNOSIS — Z860100 Personal history of colon polyps, unspecified: Secondary | ICD-10-CM

## 2023-04-10 MED ORDER — SUFLAVE 178.7 G PO SOLR: 1.0000 | Freq: Once | ORAL | 0 refills | Status: AC

## 2023-04-10 NOTE — Progress Notes (Signed)

## 2023-04-15 ENCOUNTER — Other Ambulatory Visit (HOSPITAL_COMMUNITY): Payer: Self-pay

## 2023-04-19 ENCOUNTER — Encounter: Admitting: Gastroenterology

## 2023-05-07 ENCOUNTER — Other Ambulatory Visit: Payer: Self-pay | Admitting: Family Medicine

## 2023-05-31 ENCOUNTER — Encounter: Admitting: Gastroenterology

## 2023-06-05 ENCOUNTER — Encounter: Payer: Self-pay | Admitting: Gastroenterology

## 2023-06-17 ENCOUNTER — Ambulatory Visit (AMBULATORY_SURGERY_CENTER): Admitting: Gastroenterology

## 2023-06-17 ENCOUNTER — Encounter: Payer: Self-pay | Admitting: Gastroenterology

## 2023-06-17 VITALS — BP 137/72 | HR 57 | Temp 98.3°F | Resp 11 | Ht 75.0 in | Wt 240.0 lb

## 2023-06-17 DIAGNOSIS — K644 Residual hemorrhoidal skin tags: Secondary | ICD-10-CM | POA: Diagnosis not present

## 2023-06-17 DIAGNOSIS — D123 Benign neoplasm of transverse colon: Secondary | ICD-10-CM | POA: Diagnosis not present

## 2023-06-17 DIAGNOSIS — Z860100 Personal history of colon polyps, unspecified: Secondary | ICD-10-CM

## 2023-06-17 DIAGNOSIS — K648 Other hemorrhoids: Secondary | ICD-10-CM

## 2023-06-17 DIAGNOSIS — Z1211 Encounter for screening for malignant neoplasm of colon: Secondary | ICD-10-CM

## 2023-06-17 DIAGNOSIS — K5730 Diverticulosis of large intestine without perforation or abscess without bleeding: Secondary | ICD-10-CM | POA: Diagnosis not present

## 2023-06-17 DIAGNOSIS — D122 Benign neoplasm of ascending colon: Secondary | ICD-10-CM

## 2023-06-17 DIAGNOSIS — Z860101 Personal history of adenomatous and serrated colon polyps: Secondary | ICD-10-CM | POA: Diagnosis not present

## 2023-06-17 DIAGNOSIS — K635 Polyp of colon: Secondary | ICD-10-CM

## 2023-06-17 DIAGNOSIS — I10 Essential (primary) hypertension: Secondary | ICD-10-CM | POA: Diagnosis not present

## 2023-06-17 DIAGNOSIS — E785 Hyperlipidemia, unspecified: Secondary | ICD-10-CM | POA: Diagnosis not present

## 2023-06-17 MED ORDER — SODIUM CHLORIDE 0.9 % IV SOLN: 500.0000 mL | Freq: Once | INTRAVENOUS | Status: DC

## 2023-06-17 NOTE — Patient Instructions (Signed)
**  Handout given on polyps, diverticulosis and hemorrhoids**   YOU HAD AN ENDOSCOPIC PROCEDURE TODAY AT THE [facility]:   Refer to the procedure report that was given to you for any specific questions about what was found during the examination.  If the procedure report does not answer your questions, please call your gastroenterologist to clarify.  If you requested that your care partner not be given the details of your procedure findings, then the procedure report has been included in a sealed envelope for you to review at your convenience later.  YOU SHOULD EXPECT: Some feelings of bloating in the abdomen. Passage of more gas than usual.  Walking can help get rid of the air that was put into your GI tract during the procedure and reduce the bloating. If you had a lower endoscopy (such as a colonoscopy or flexible sigmoidoscopy) you may notice spotting of blood in your stool or on the toilet paper. If you underwent a bowel prep for your procedure, you may not have a normal bowel movement for a few days.  Please Note:  You might notice some irritation and congestion in your nose or some drainage.  This is from the oxygen used during your procedure.  There is no need for concern and it should clear up in a day or so.  SYMPTOMS TO REPORT IMMEDIATELY:  Following lower endoscopy (colonoscopy or flexible sigmoidoscopy):  Excessive amounts of blood in the stool  Significant tenderness or worsening of abdominal pains  Swelling of the abdomen that is new, acute  Fever of 100F or higher  For urgent or emergent issues, a gastroenterologist can be reached at any hour by calling [phone]. Do not use MyChart messaging for urgent concerns.    DIET:  We do recommend a small meal at first, but then you may proceed to your regular diet.  Drink plenty of fluids but you should avoid alcoholic beverages for 24 hours.  ACTIVITY:  You should plan to take it easy for the rest of today and you  should NOT DRIVE or use heavy machinery until tomorrow (because of the sedation medicines used during the test).    FOLLOW UP: Our staff will call the number listed on your records the next business day following your procedure.  We will call around 7:15- 8:00 am to check on you and address any questions or concerns that you may have regarding the information given to you following your procedure. If we do not reach you, we will leave a message.     If any biopsies were taken you will be contacted by phone or by letter within the next 1-3 weeks.  Please call us  at [phone] if you have not heard about the biopsies in 3 weeks.    SIGNATURES/CONFIDENTIALITY: You and/or your care partner have signed paperwork which will be entered into your electronic medical record.  These signatures attest to the fact that that the information above on your After Visit Summary has been reviewed and is understood.  Full responsibility of the confidentiality of this discharge information lies with you and/or your care-partner.

## 2023-06-17 NOTE — Op Note (Signed)
 [name] Procedure Date: 06/17/2023 8:44 AM MRN: [id number] Endoscopist: Sergio Dandy , MD, [id number] Age: 68 Referring MD:  Date of Birth: January 29, 1956 Gender: Male Account #: [id number] Procedure:                Colonoscopy Indications:              High risk colon cancer surveillance: Personal                            history of colonic polyps, High risk colon cancer                            surveillance: Personal history of adenoma less than                            10 mm in size Medicines:                Monitored Anesthesia Care Procedure:                Pre-Anesthesia Assessment:                           - Prior to the procedure, a History and Physical                            was performed, and patient medications and                            allergies were reviewed. The patient's tolerance of                            previous anesthesia was also reviewed. The risks                            and benefits of the procedure and the sedation                            options and risks were discussed with the patient.                            All questions were answered, and informed consent                            was obtained. Prior Anticoagulants: The patient has                            taken no anticoagulant or antiplatelet agents. ASA                            Grade Assessment: II - A patient with mild systemic                            disease. After reviewing the risks and benefits,  the patient was deemed in satisfactory condition to                            undergo the procedure.                           After obtaining informed consent, the colonoscope                            was passed under direct vision. Throughout the                            procedure, the patient's blood pressure, pulse, and                            oxygen saturations were monitored continuously.  The                            Olympus Scope SN [id number] was introduced through the                            anus and advanced to the the cecum, identified by                            appendiceal orifice and ileocecal valve. The                            colonoscopy was performed without difficulty. The                            patient tolerated the procedure well. The quality                            of the bowel preparation was good. The ileocecal                            valve, appendiceal orifice, and rectum were                            photographed. Scope In: 8:59:37 AM Scope Out: 9:15:33 AM Scope Withdrawal Time: 0 hours 13 minutes 3 seconds  Total Procedure Duration: 0 hours 15 minutes 56 seconds  Findings:                 The perianal and digital rectal examinations were                            normal.                           A 1 mm polyp was found in the ascending colon. The                            polyp was sessile. The polyp was removed with a  cold biopsy forceps. Resection and retrieval were                            complete.                           A 7 mm polyp was found in the transverse colon. The                            polyp was sessile. The polyp was removed with a                            cold snare. Resection and retrieval were complete.                           Scattered small-mouthed diverticula were found in                            the sigmoid colon and descending colon.                           Non-bleeding external and internal hemorrhoids were                            found during retroflexion. The hemorrhoids were                            medium-sized. Complications:            No immediate complications. Estimated Blood Loss:     Estimated blood loss was minimal. Impression:               - One 1 mm polyp in the ascending colon, removed                            with a cold biopsy forceps.  Resected and retrieved.                           - One 7 mm polyp in the transverse colon, removed                            with a cold snare. Resected and retrieved.                           - Diverticulosis in the sigmoid colon and in the                            descending colon.                           - Non-bleeding external and internal hemorrhoids. Recommendation:           - Patient has a contact number available for  emergencies. The signs and symptoms of potential                            delayed complications were discussed with the                            patient. Return to normal activities tomorrow.                            Written discharge instructions were provided to the                            patient.                           - Resume previous diet.                           - Continue present medications.                           - Await pathology results.                           - Repeat colonoscopy in 5-10 years for surveillance                            based on pathology results. Bradley Cole V. Isauro Skelley, MD 06/17/2023 9:24:32 AM This report has been signed electronically.

## 2023-06-17 NOTE — Progress Notes (Signed)
 Called to room to assist during endoscopic procedure.  Patient ID and intended procedure confirmed with present staff. Received instructions for my participation in the procedure from the performing physician.

## 2023-06-17 NOTE — Progress Notes (Signed)
 [facility] History and Physical   Primary Care Physician:  Almira Jaeger, MD   Reason for Procedure:  History of adenomatous colon polyps  Plan:    Surveillance colonoscopy with possible interventions as needed     HPI: Bradley Cole. is a very pleasant 68 y.o. male here for surveillance colonoscopy. Denies any nausea, vomiting, abdominal pain, melena or bright red blood per rectum  The risks and benefits as well as alternatives of endoscopic procedure(s) have been discussed and reviewed. All questions answered. The patient agrees to proceed.    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 Ola Berger    Hyperlipidemia    LDL goal < 100, ideally < 70   Hypertension    Meniscal injury, left, initial encounter    [facility] following- has a flap. will wait on surgery until cannot bear it.     Past Surgical History:  Procedure Laterality Date   COLONOSCOPY  2007   negative, [facility]   COLONOSCOPY W/ POLYPECTOMY  01/18/2017   2-TA's   KNEE SURGERY     R knee torn ligament   MOHS SURGERY     lip 2023   POLYPECTOMY      Prior to Admission medications   Medication Sig Start Date End Date Taking? Authorizing Provider  amLODipine  (NORVASC ) 10 MG tablet Take 1 tablet (10 mg total) by mouth daily. 02/26/23  Yes Almira Jaeger, MD  benazepril  (LOTENSIN ) 40 MG tablet TAKE 1 TABLET(40 MG) BY MOUTH DAILY 05/07/23  Yes Almira Jaeger, MD  Multiple Vitamins-Minerals (MEGA MULTI MEN PO) Take by mouth daily.   Yes [provider]  rosuvastatin  (CRESTOR ) 20 MG tablet TAKE 1 TABLET BY MOUTH 3 TIMES A WEEK. 08/17/22  Yes Almira Jaeger, MD  TOPROL  XL 50 MG 24 hr tablet TAKE 1 TABLET BY MOUTH EVERY DAY WITH OR IMMEDIATELY FOLLOWING A MEAL 11/06/22   Yes Almira Jaeger, MD  vitamin B-12 (CYANOCOBALAMIN ) 100 MCG tablet Take 100 mcg by mouth daily.   Yes [provider]    Current Outpatient Medications  Medication Sig Dispense Refill   amLODipine  (NORVASC ) 10 MG tablet Take 1 tablet (10 mg total) by mouth daily. 90 tablet 2   benazepril  (LOTENSIN ) 40 MG tablet TAKE 1 TABLET(40 MG) BY MOUTH DAILY 90 tablet 3   Multiple Vitamins-Minerals (MEGA MULTI MEN PO) Take by mouth daily.     rosuvastatin  (CRESTOR ) 20 MG tablet TAKE 1 TABLET BY MOUTH 3 TIMES A WEEK. 270 tablet 3   TOPROL  XL 50 MG 24 hr tablet TAKE 1 TABLET BY MOUTH EVERY DAY WITH OR IMMEDIATELY FOLLOWING A MEAL 90 tablet 2   vitamin B-12 (CYANOCOBALAMIN ) 100 MCG tablet Take 100 mcg by mouth daily.     Current Facility-Administered Medications  Medication Dose Route Frequency Provider Last Rate Last Admin   0.9 %  sodium chloride  infusion  500 mL Intravenous Once Yudit Modesitt V, MD        Allergies as of 06/17/2023 - Review Complete 06/17/2023  Allergen Reaction Noted   Augmentin  [amoxicillin -pot clavulanate] Rash 03/03/2018   Penicillins Rash     Family History  Problem Relation Age of Onset  Hypertension Mother    Heart disease Mother        valvular heart disease   Heart attack Mother        based on findings @ valve replacement   Migraines Mother    Hypertension Father    Mitral valve prolapse Father    Lymphoma Father        mantel cell   Glaucoma Father    Macular degeneration Father    Benign prostatic hyperplasia Father    Diabetes Father    Heart disease Maternal Grandmother    Heart attack Maternal Grandfather        in 29s   Heart disease Maternal Grandfather    Diabetes Paternal Grandmother    Hypertension Paternal Grandmother    Alcohol abuse Son    Stroke Neg Hx    Colon cancer Neg Hx    Stomach cancer Neg Hx    Rectal cancer Neg Hx    Esophageal cancer Neg Hx     Social History   Socioeconomic History   Marital status:  Married    Spouse name: Not on file   Number of children: Not on file   Years of education: Not on file   Highest education level: Not on file  Occupational History   Not on file  Tobacco Use   Smoking status: Former    Types: Cigars    Quit date: 11/07/2010    Years since quitting: 12.6   Smokeless tobacco: Former    Types: Chew   Tobacco comments:    quite smoking a year ago.  Vaping Use   Vaping status: Never Used  Substance and Sexual Activity   Alcohol use: Not Currently    Alcohol/week: 3.0 standard drinks of alcohol    Types: 3 Glasses of wine per week    Comment: stopped drinking 3 years ago   Drug use: No   Sexual activity: Yes  Other Topics Concern   Not on file  Social History Narrative   Married 37 years in 2017. 2 children 1 and 31. 4 grandkids (1 on way included) in 2017.       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    -will still be involved for 3 years      Hobbies: maintaining farm, judging field trials- any kind of retreiver, learning to sort cattle   Social Drivers of Health   Financial Resource Strain: Low Risk  (12/31/2022)   Overall Financial Resource Strain (CARDIA)    Difficulty of Paying Living Expenses: Not hard at all  Food Insecurity: No Food Insecurity (12/31/2022)   Hunger Vital Sign    Worried About Running Out of Food in the Last Year: Never true    Ran Out of Food in the Last Year: Never true  Transportation Needs: No Transportation Needs (12/31/2022)   PRAPARE - Administrator, Civil Service (Medical): No    Lack of Transportation (Non-Medical): No  Physical Activity: Sufficiently Active (12/31/2022)   Exercise Vital Sign    Days of Exercise per Week: 4 days    Minutes of Exercise per Session: 40 min  Stress: No Stress Concern Present (12/31/2022)   [employer] of Occupational Health - Occupational Stress Questionnaire    Feeling of Stress :  Not at all  Social Connections: Moderately Integrated (12/31/2022)   Social Connection and Isolation Panel [NHANES]    Frequency of Communication with  Friends and Family: More than three times a week    Frequency of Social Gatherings with Friends and Family: More than three times a week    Attends Religious Services: More than 4 times per year    Active Member of [employer] or Organizations: No    Attends Banker Meetings: Never    Marital Status: Married  Catering manager Violence: Not At Risk (12/31/2022)   Humiliation, Afraid, Rape, and Kick questionnaire    Fear of Current or Ex-Partner: No    Emotionally Abused: No    Physically Abused: No    Sexually Abused: No    Review of Systems:  All other review of systems negative except as mentioned in the HPI.  Physical Exam: Vital signs in last 24 hours: BP (!) 173/80   Pulse 66   Temp 98.3 F (36.8 C) (Temporal)   Resp 10   Ht 6\' 3"  (1.905 m)   Wt 240 lb (108.9 kg)   SpO2 100%   BMI 30.00 kg/m  General:   Alert, NAD Lungs:  Clear .   Heart:  Regular rate and rhythm Abdomen:  Soft, nontender and nondistended. Neuro/Psych:  Alert and cooperative. Normal mood and affect. A and O x 3  Reviewed labs, radiology imaging, old records and pertinent past GI work up  Patient is appropriate for planned procedure(s) and anesthesia in an ambulatory setting   K. Veena Carlise Stofer , MD [phone]

## 2023-06-17 NOTE — Progress Notes (Signed)
 Pt's states no medical or surgical changes since previsit or office visit.

## 2023-06-18 ENCOUNTER — Telehealth: Payer: Self-pay

## 2023-06-18 NOTE — Telephone Encounter (Signed)
  Follow up Call-     06/17/2023    8:17 AM  Call back number  Post procedure Call Back phone  # [phone]  Permission to leave phone message Yes     Patient questions:  Do you have a fever, pain , or abdominal swelling? No. Pain Score  0 *  Have you tolerated food without any problems? Yes.    Have you been able to return to your normal activities? Yes.    Do you have any questions about your discharge instructions: Diet   No. Medications  No. Follow up visit  No.  Do you have questions or concerns about your Care? No.  Actions: * If pain score is 4 or above: No action needed, pain <4.

## 2023-06-19 LAB — SURGICAL PATHOLOGY

## 2023-06-29 ENCOUNTER — Other Ambulatory Visit: Payer: Self-pay | Admitting: Family Medicine

## 2023-06-29 DIAGNOSIS — I10 Essential (primary) hypertension: Secondary | ICD-10-CM

## 2023-07-17 ENCOUNTER — Ambulatory Visit: Payer: Self-pay | Admitting: Gastroenterology

## 2023-07-19 ENCOUNTER — Encounter: Payer: Self-pay | Admitting: Pharmacist

## 2023-07-19 NOTE — Progress Notes (Signed)
 Pharmacy Quality Measure Review  This patient is appearing on a report for being at risk of failing the adherence measure for cholesterol (statin) and hypertension (ACEi/ARB) medications this calendar year.   Medication: Rosuvastatin   Last fill date: 02/16/2023 for 90 day supply per adherence report.  Checked Dr Anson Basta database and last refill was for 90 day supply or #39 tablets on 06/26/2023. Updated Rx will be needed for next refill.   Medication: benazepril  Last fill date: 03/29/205 for 90 day supply - patient has 3 refill remaining.   Insurance report was not up to date. No action needed at this time.   Cecilie Coffee, PharmD Clinical Pharmacist [facility]  Population Health [phone]

## 2023-09-23 ENCOUNTER — Other Ambulatory Visit: Payer: Self-pay | Admitting: Family Medicine

## 2023-10-28 DIAGNOSIS — D3131 Benign neoplasm of right choroid: Secondary | ICD-10-CM | POA: Diagnosis not present

## 2023-10-28 DIAGNOSIS — H47323 Drusen of optic disc, bilateral: Secondary | ICD-10-CM | POA: Diagnosis not present

## 2023-10-28 DIAGNOSIS — H35033 Hypertensive retinopathy, bilateral: Secondary | ICD-10-CM | POA: Diagnosis not present

## 2023-10-28 DIAGNOSIS — H43821 Vitreomacular adhesion, right eye: Secondary | ICD-10-CM | POA: Diagnosis not present

## 2023-10-28 DIAGNOSIS — H43812 Vitreous degeneration, left eye: Secondary | ICD-10-CM | POA: Diagnosis not present

## 2023-10-28 DIAGNOSIS — H2513 Age-related nuclear cataract, bilateral: Secondary | ICD-10-CM | POA: Diagnosis not present

## 2023-10-30 DIAGNOSIS — M25511 Pain in right shoulder: Secondary | ICD-10-CM | POA: Diagnosis not present

## 2023-10-30 DIAGNOSIS — M25512 Pain in left shoulder: Secondary | ICD-10-CM | POA: Diagnosis not present

## 2023-11-04 DIAGNOSIS — M7542 Impingement syndrome of left shoulder: Secondary | ICD-10-CM | POA: Diagnosis not present

## 2023-11-04 DIAGNOSIS — M7541 Impingement syndrome of right shoulder: Secondary | ICD-10-CM | POA: Diagnosis not present

## 2023-11-15 DIAGNOSIS — M25512 Pain in left shoulder: Secondary | ICD-10-CM | POA: Diagnosis not present

## 2023-11-15 DIAGNOSIS — M25511 Pain in right shoulder: Secondary | ICD-10-CM | POA: Diagnosis not present

## 2023-11-26 DIAGNOSIS — Z0100 Encounter for examination of eyes and vision without abnormal findings: Secondary | ICD-10-CM | POA: Diagnosis not present

## 2023-11-26 DIAGNOSIS — H43812 Vitreous degeneration, left eye: Secondary | ICD-10-CM | POA: Diagnosis not present

## 2023-11-26 DIAGNOSIS — D3131 Benign neoplasm of right choroid: Secondary | ICD-10-CM | POA: Diagnosis not present

## 2023-11-26 DIAGNOSIS — H2513 Age-related nuclear cataract, bilateral: Secondary | ICD-10-CM | POA: Diagnosis not present

## 2023-12-02 DIAGNOSIS — M542 Cervicalgia: Secondary | ICD-10-CM | POA: Diagnosis not present

## 2023-12-02 DIAGNOSIS — M25512 Pain in left shoulder: Secondary | ICD-10-CM | POA: Diagnosis not present

## 2023-12-13 ENCOUNTER — Other Ambulatory Visit: Payer: Self-pay | Admitting: Family Medicine

## 2024-03-26 ENCOUNTER — Encounter: Payer: Medicare HMO | Admitting: Family Medicine
